# Patient Record
Sex: Male | Born: 1955 | Race: White | Hispanic: No | Smoking: Former smoker
Health system: Southern US, Community
[De-identification: ages and names within clinical notes are randomized; demographics above are authoritative.]

## PROBLEM LIST (undated history)

## (undated) DIAGNOSIS — E785 Hyperlipidemia, unspecified: Secondary | ICD-10-CM

## (undated) DIAGNOSIS — F419 Anxiety disorder, unspecified: Secondary | ICD-10-CM

## (undated) DIAGNOSIS — G8929 Other chronic pain: Secondary | ICD-10-CM

## (undated) DIAGNOSIS — R55 Syncope and collapse: Secondary | ICD-10-CM

## (undated) DIAGNOSIS — S30861A Insect bite (nonvenomous) of abdominal wall, initial encounter: Secondary | ICD-10-CM

## (undated) DIAGNOSIS — J45909 Unspecified asthma, uncomplicated: Secondary | ICD-10-CM

## (undated) DIAGNOSIS — G4730 Sleep apnea, unspecified: Secondary | ICD-10-CM

## (undated) DIAGNOSIS — J449 Chronic obstructive pulmonary disease, unspecified: Secondary | ICD-10-CM

## (undated) DIAGNOSIS — Z720 Tobacco use: Secondary | ICD-10-CM

## (undated) DIAGNOSIS — I6529 Occlusion and stenosis of unspecified carotid artery: Secondary | ICD-10-CM

## (undated) DIAGNOSIS — R569 Unspecified convulsions: Secondary | ICD-10-CM

## (undated) DIAGNOSIS — W57XXXA Bitten or stung by nonvenomous insect and other nonvenomous arthropods, initial encounter: Secondary | ICD-10-CM

## (undated) DIAGNOSIS — E559 Vitamin D deficiency, unspecified: Secondary | ICD-10-CM

## (undated) DIAGNOSIS — R079 Chest pain, unspecified: Secondary | ICD-10-CM

## (undated) DIAGNOSIS — G43909 Migraine, unspecified, not intractable, without status migrainosus: Secondary | ICD-10-CM

## (undated) DIAGNOSIS — R739 Hyperglycemia, unspecified: Secondary | ICD-10-CM

## (undated) DIAGNOSIS — K219 Gastro-esophageal reflux disease without esophagitis: Secondary | ICD-10-CM

## (undated) DIAGNOSIS — M1990 Unspecified osteoarthritis, unspecified site: Secondary | ICD-10-CM

## (undated) HISTORY — DX: Tobacco use: Z72.0

## (undated) HISTORY — DX: Gastro-esophageal reflux disease without esophagitis: K21.9

## (undated) HISTORY — DX: Bitten or stung by nonvenomous insect and other nonvenomous arthropods, initial encounter: W57.XXXA

## (undated) HISTORY — DX: Hyperlipidemia, unspecified: E78.5

## (undated) HISTORY — DX: Chest pain, unspecified: R07.9

## (undated) HISTORY — DX: Syncope and collapse: R55

## (undated) HISTORY — DX: Occlusion and stenosis of unspecified carotid artery: I65.29

## (undated) HISTORY — PX: COLON SURGERY: SHX602

## (undated) HISTORY — PX: GALLBLADDER SURGERY: SHX652

## (undated) HISTORY — DX: Unspecified osteoarthritis, unspecified site: M19.90

## (undated) HISTORY — PX: ROTATOR CUFF REPAIR: SHX139

## (undated) HISTORY — DX: Hyperglycemia, unspecified: R73.9

## (undated) HISTORY — DX: Migraine, unspecified, not intractable, without status migrainosus: G43.909

## (undated) HISTORY — DX: Vitamin D deficiency, unspecified: E55.9

## (undated) HISTORY — DX: Unspecified convulsions: R56.9

## (undated) HISTORY — DX: Insect bite (nonvenomous) of abdominal wall, initial encounter: S30.861A

---

## 2000-03-05 ENCOUNTER — Encounter (INDEPENDENT_AMBULATORY_CARE_PROVIDER_SITE_OTHER): Payer: Self-pay

## 2000-03-05 ENCOUNTER — Other Ambulatory Visit: Admission: RE | Admit: 2000-03-05 | Discharge: 2000-03-05 | Payer: Self-pay | Admitting: Otolaryngology

## 2001-11-28 ENCOUNTER — Ambulatory Visit (HOSPITAL_COMMUNITY): Admission: RE | Admit: 2001-11-28 | Discharge: 2001-11-28 | Payer: Self-pay | Admitting: Pulmonary Disease

## 2001-12-10 ENCOUNTER — Ambulatory Visit (HOSPITAL_COMMUNITY): Admission: RE | Admit: 2001-12-10 | Discharge: 2001-12-10 | Payer: Self-pay | Admitting: Pulmonary Disease

## 2001-12-19 ENCOUNTER — Ambulatory Visit (HOSPITAL_COMMUNITY): Admission: RE | Admit: 2001-12-19 | Discharge: 2001-12-19 | Payer: Self-pay | Admitting: Pulmonary Disease

## 2002-02-04 ENCOUNTER — Ambulatory Visit (HOSPITAL_COMMUNITY): Admission: RE | Admit: 2002-02-04 | Discharge: 2002-02-04 | Payer: Self-pay | Admitting: Cardiology

## 2002-02-21 ENCOUNTER — Ambulatory Visit (HOSPITAL_COMMUNITY): Admission: RE | Admit: 2002-02-21 | Discharge: 2002-02-21 | Payer: Self-pay | Admitting: Pulmonary Disease

## 2002-08-24 ENCOUNTER — Emergency Department (HOSPITAL_COMMUNITY): Admission: EM | Admit: 2002-08-24 | Discharge: 2002-08-24 | Payer: Self-pay | Admitting: Emergency Medicine

## 2002-08-30 ENCOUNTER — Emergency Department (HOSPITAL_COMMUNITY): Admission: EM | Admit: 2002-08-30 | Discharge: 2002-08-31 | Payer: Self-pay | Admitting: Internal Medicine

## 2002-08-31 ENCOUNTER — Encounter: Payer: Self-pay | Admitting: Internal Medicine

## 2003-08-26 ENCOUNTER — Ambulatory Visit (HOSPITAL_COMMUNITY): Admission: RE | Admit: 2003-08-26 | Discharge: 2003-08-26 | Payer: Self-pay | Admitting: Family Medicine

## 2003-08-26 ENCOUNTER — Encounter: Payer: Self-pay | Admitting: Family Medicine

## 2003-10-03 ENCOUNTER — Inpatient Hospital Stay (HOSPITAL_COMMUNITY): Admission: EM | Admit: 2003-10-03 | Discharge: 2003-10-07 | Payer: Self-pay | Admitting: Internal Medicine

## 2003-11-06 ENCOUNTER — Emergency Department (HOSPITAL_COMMUNITY): Admission: EM | Admit: 2003-11-06 | Discharge: 2003-11-06 | Payer: Self-pay | Admitting: Emergency Medicine

## 2004-01-21 ENCOUNTER — Emergency Department (HOSPITAL_COMMUNITY): Admission: EM | Admit: 2004-01-21 | Discharge: 2004-01-21 | Payer: Self-pay | Admitting: Emergency Medicine

## 2004-02-07 ENCOUNTER — Emergency Department (HOSPITAL_COMMUNITY): Admission: EM | Admit: 2004-02-07 | Discharge: 2004-02-07 | Payer: Self-pay | Admitting: Emergency Medicine

## 2004-02-18 ENCOUNTER — Emergency Department (HOSPITAL_COMMUNITY): Admission: EM | Admit: 2004-02-18 | Discharge: 2004-02-18 | Payer: Self-pay | Admitting: Emergency Medicine

## 2004-03-12 ENCOUNTER — Emergency Department (HOSPITAL_COMMUNITY): Admission: EM | Admit: 2004-03-12 | Discharge: 2004-03-12 | Payer: Self-pay | Admitting: Emergency Medicine

## 2004-03-30 ENCOUNTER — Emergency Department (HOSPITAL_COMMUNITY): Admission: EM | Admit: 2004-03-30 | Discharge: 2004-03-30 | Payer: Self-pay | Admitting: Emergency Medicine

## 2004-05-02 ENCOUNTER — Emergency Department (HOSPITAL_COMMUNITY): Admission: EM | Admit: 2004-05-02 | Discharge: 2004-05-02 | Payer: Self-pay | Admitting: Emergency Medicine

## 2004-05-09 ENCOUNTER — Ambulatory Visit (HOSPITAL_COMMUNITY): Admission: RE | Admit: 2004-05-09 | Discharge: 2004-05-09 | Payer: Self-pay | Admitting: *Deleted

## 2004-07-06 ENCOUNTER — Inpatient Hospital Stay (HOSPITAL_COMMUNITY): Admission: RE | Admit: 2004-07-06 | Discharge: 2004-07-20 | Payer: Self-pay | Admitting: Psychiatry

## 2004-07-07 ENCOUNTER — Emergency Department (HOSPITAL_COMMUNITY): Admission: EM | Admit: 2004-07-07 | Discharge: 2004-07-07 | Payer: Self-pay | Admitting: Emergency Medicine

## 2004-09-23 ENCOUNTER — Inpatient Hospital Stay (HOSPITAL_COMMUNITY): Admission: EM | Admit: 2004-09-23 | Discharge: 2004-09-30 | Payer: Self-pay | Admitting: Psychiatry

## 2004-09-23 ENCOUNTER — Ambulatory Visit: Payer: Self-pay | Admitting: Psychiatry

## 2004-12-06 ENCOUNTER — Emergency Department (HOSPITAL_COMMUNITY): Admission: EM | Admit: 2004-12-06 | Discharge: 2004-12-06 | Payer: Self-pay | Admitting: Emergency Medicine

## 2004-12-09 ENCOUNTER — Inpatient Hospital Stay (HOSPITAL_COMMUNITY): Admission: RE | Admit: 2004-12-09 | Discharge: 2004-12-19 | Payer: Self-pay | Admitting: Psychiatry

## 2004-12-09 ENCOUNTER — Ambulatory Visit: Payer: Self-pay | Admitting: Psychiatry

## 2005-03-06 ENCOUNTER — Emergency Department (HOSPITAL_COMMUNITY): Admission: EM | Admit: 2005-03-06 | Discharge: 2005-03-06 | Payer: Self-pay | Admitting: Emergency Medicine

## 2005-06-23 ENCOUNTER — Ambulatory Visit: Payer: Self-pay | Admitting: Psychiatry

## 2005-06-23 ENCOUNTER — Inpatient Hospital Stay (HOSPITAL_COMMUNITY): Admission: RE | Admit: 2005-06-23 | Discharge: 2005-06-28 | Payer: Self-pay | Admitting: Psychiatry

## 2006-05-14 ENCOUNTER — Observation Stay (HOSPITAL_COMMUNITY): Admission: EM | Admit: 2006-05-14 | Discharge: 2006-05-16 | Payer: Self-pay | Admitting: Emergency Medicine

## 2006-05-14 ENCOUNTER — Ambulatory Visit: Payer: Self-pay | Admitting: Internal Medicine

## 2006-05-14 ENCOUNTER — Ambulatory Visit: Payer: Self-pay | Admitting: *Deleted

## 2006-05-15 ENCOUNTER — Encounter (INDEPENDENT_AMBULATORY_CARE_PROVIDER_SITE_OTHER): Payer: Self-pay | Admitting: *Deleted

## 2006-07-04 ENCOUNTER — Ambulatory Visit: Payer: Self-pay | Admitting: Internal Medicine

## 2006-07-11 ENCOUNTER — Ambulatory Visit (HOSPITAL_COMMUNITY): Admission: RE | Admit: 2006-07-11 | Discharge: 2006-07-11 | Payer: Self-pay | Admitting: Internal Medicine

## 2006-08-30 ENCOUNTER — Emergency Department (HOSPITAL_COMMUNITY): Admission: EM | Admit: 2006-08-30 | Discharge: 2006-08-30 | Payer: Self-pay | Admitting: Emergency Medicine

## 2006-09-11 ENCOUNTER — Ambulatory Visit: Payer: Self-pay | Admitting: Internal Medicine

## 2006-09-17 ENCOUNTER — Ambulatory Visit: Payer: Self-pay | Admitting: Internal Medicine

## 2006-09-17 ENCOUNTER — Ambulatory Visit (HOSPITAL_COMMUNITY): Admission: RE | Admit: 2006-09-17 | Discharge: 2006-09-17 | Payer: Self-pay | Admitting: Internal Medicine

## 2006-09-17 ENCOUNTER — Encounter (INDEPENDENT_AMBULATORY_CARE_PROVIDER_SITE_OTHER): Payer: Self-pay | Admitting: Specialist

## 2006-10-01 ENCOUNTER — Emergency Department (HOSPITAL_COMMUNITY): Admission: EM | Admit: 2006-10-01 | Discharge: 2006-10-01 | Payer: Self-pay | Admitting: Emergency Medicine

## 2006-10-04 ENCOUNTER — Ambulatory Visit: Payer: Self-pay | Admitting: Internal Medicine

## 2006-10-17 ENCOUNTER — Emergency Department (HOSPITAL_COMMUNITY): Admission: EM | Admit: 2006-10-17 | Discharge: 2006-10-17 | Payer: Self-pay | Admitting: Emergency Medicine

## 2006-10-18 ENCOUNTER — Ambulatory Visit: Payer: Self-pay | Admitting: Internal Medicine

## 2006-10-19 ENCOUNTER — Ambulatory Visit (HOSPITAL_COMMUNITY): Admission: RE | Admit: 2006-10-19 | Discharge: 2006-10-19 | Payer: Self-pay | Admitting: Internal Medicine

## 2006-10-19 ENCOUNTER — Encounter (INDEPENDENT_AMBULATORY_CARE_PROVIDER_SITE_OTHER): Payer: Self-pay | Admitting: Specialist

## 2006-12-06 ENCOUNTER — Ambulatory Visit: Payer: Self-pay | Admitting: Internal Medicine

## 2006-12-17 ENCOUNTER — Ambulatory Visit: Payer: Self-pay | Admitting: Internal Medicine

## 2006-12-18 ENCOUNTER — Ambulatory Visit (HOSPITAL_COMMUNITY): Admission: RE | Admit: 2006-12-18 | Discharge: 2006-12-18 | Payer: Self-pay | Admitting: Internal Medicine

## 2006-12-21 ENCOUNTER — Ambulatory Visit (HOSPITAL_COMMUNITY): Admission: RE | Admit: 2006-12-21 | Discharge: 2006-12-21 | Payer: Self-pay | Admitting: Internal Medicine

## 2006-12-28 ENCOUNTER — Emergency Department (HOSPITAL_COMMUNITY): Admission: EM | Admit: 2006-12-28 | Discharge: 2006-12-28 | Payer: Self-pay | Admitting: Emergency Medicine

## 2007-01-01 ENCOUNTER — Inpatient Hospital Stay (HOSPITAL_COMMUNITY): Admission: RE | Admit: 2007-01-01 | Discharge: 2007-01-03 | Payer: Self-pay | Admitting: General Surgery

## 2007-03-15 ENCOUNTER — Ambulatory Visit (HOSPITAL_COMMUNITY): Admission: RE | Admit: 2007-03-15 | Discharge: 2007-03-15 | Payer: Self-pay | Admitting: General Surgery

## 2007-07-31 ENCOUNTER — Ambulatory Visit: Payer: Self-pay | Admitting: Internal Medicine

## 2007-08-06 ENCOUNTER — Emergency Department (HOSPITAL_COMMUNITY): Admission: EM | Admit: 2007-08-06 | Discharge: 2007-08-07 | Payer: Self-pay | Admitting: Emergency Medicine

## 2007-09-27 IMAGING — CT CT PELVIS W/ CM
2 of 5 series · 16 of 46 positions shown, 18 images · IV contrast (Omnipaque 300)
Comparison: none

HISTORY: Right upper quadrant pain, nausea

[Series 2: abd_pel 5.0 b40f · axial · 0.74mm/px · z∈[-505,-100]mm · 13 of 95 slices shown, 15 images]
[im 7/95  soft-tissue]
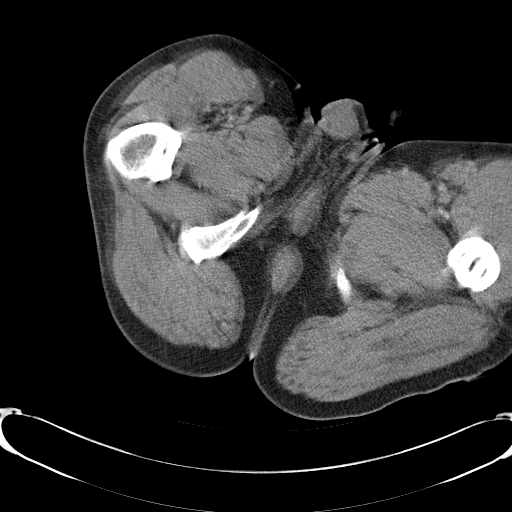
[im 7/95  bone]
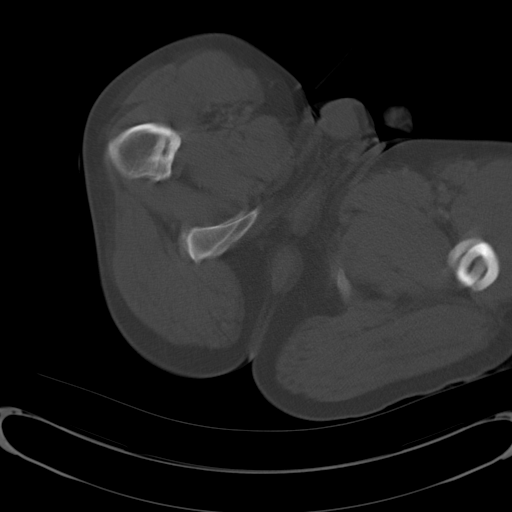
[im 14/95  soft-tissue]
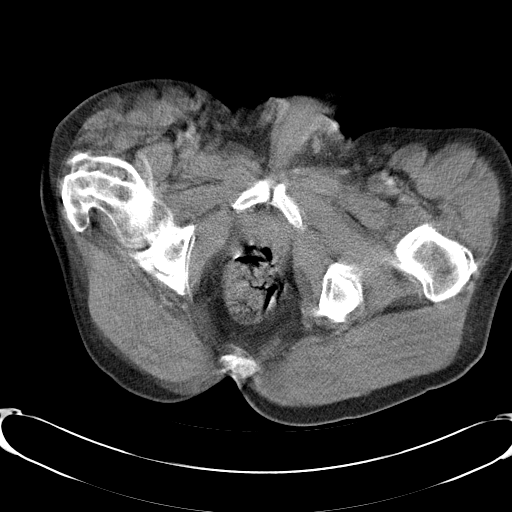
[im 21/95  soft-tissue]
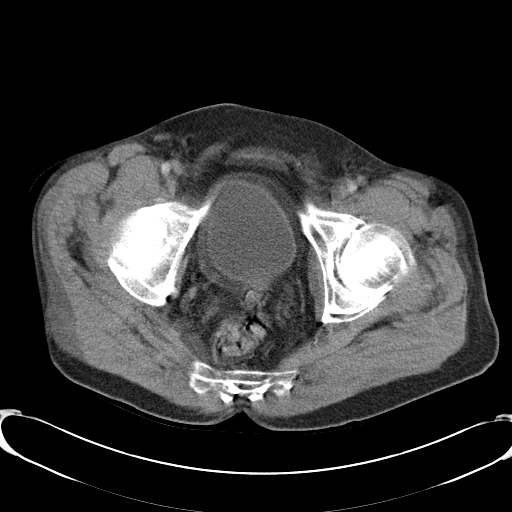
[im 27/95  soft-tissue]
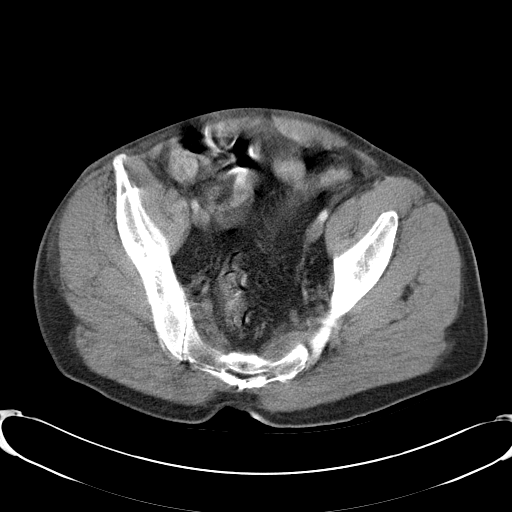
[im 34/95  soft-tissue]
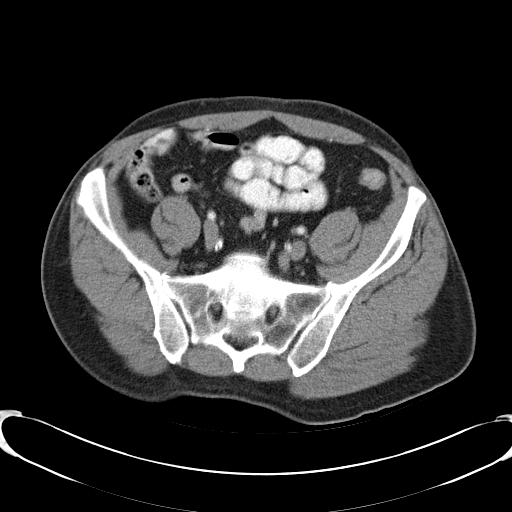
[im 41/95  soft-tissue]
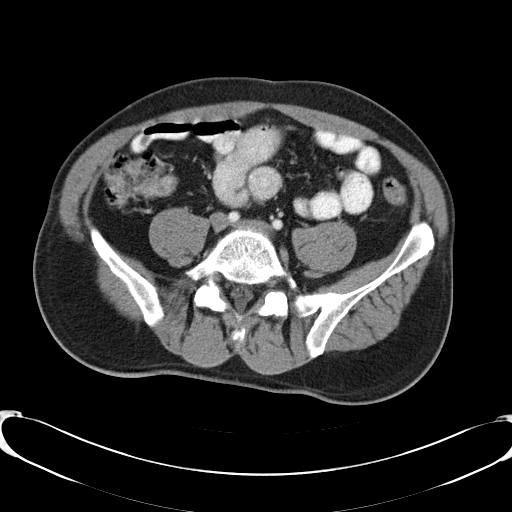
[im 48/95  soft-tissue]
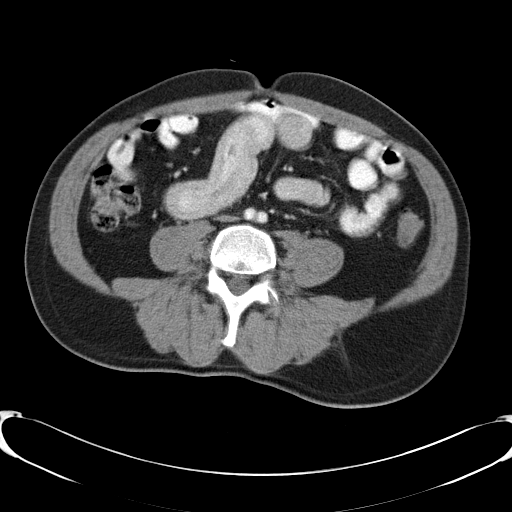
[im 54/95  soft-tissue]
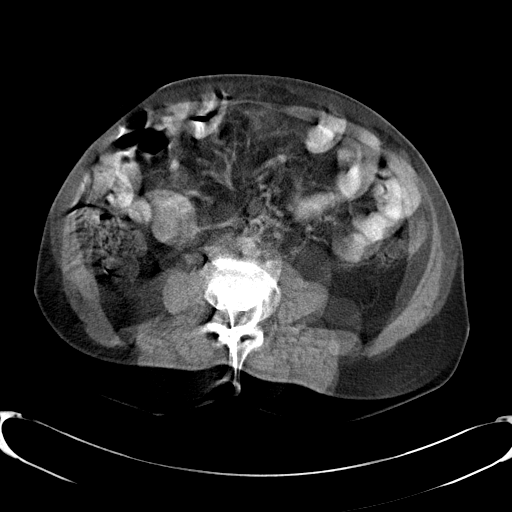
[im 61/95  soft-tissue]
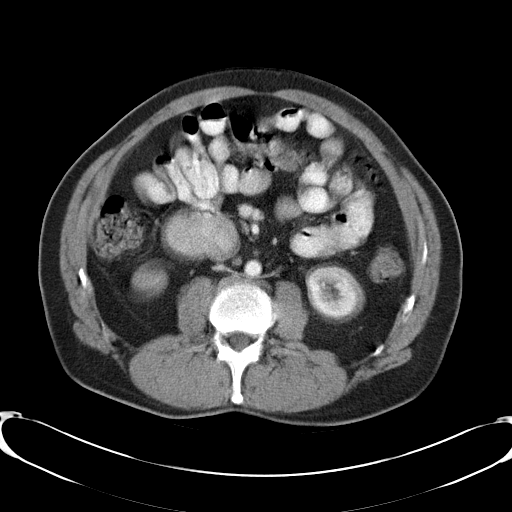
[im 61/95  bone]
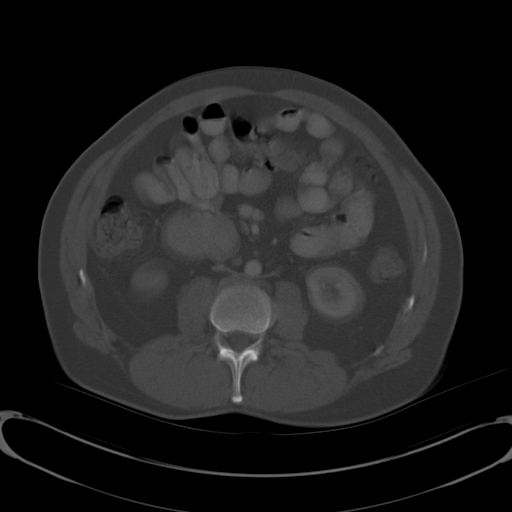
[im 68/95  soft-tissue]
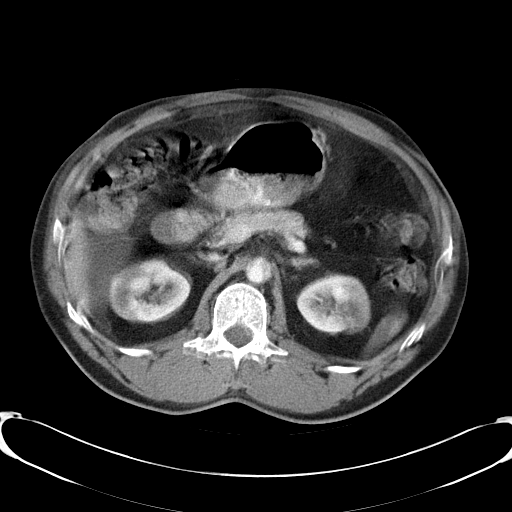
[im 74/95  soft-tissue]
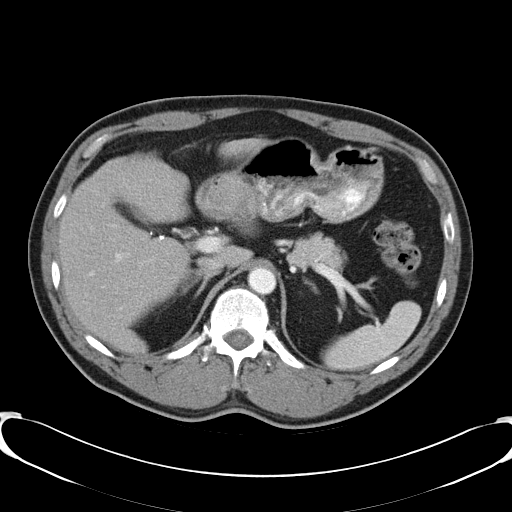
[im 81/95  soft-tissue]
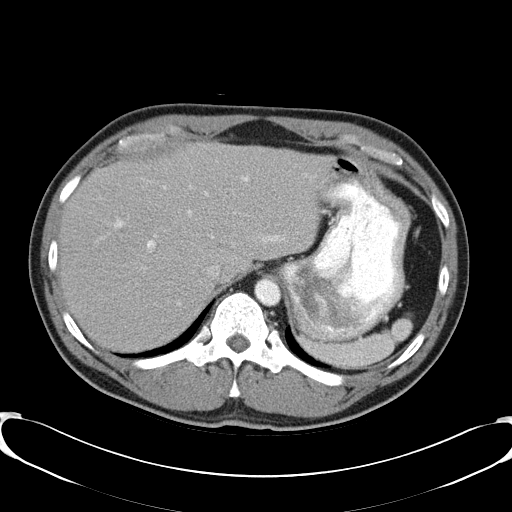
[im 88/95  soft-tissue]
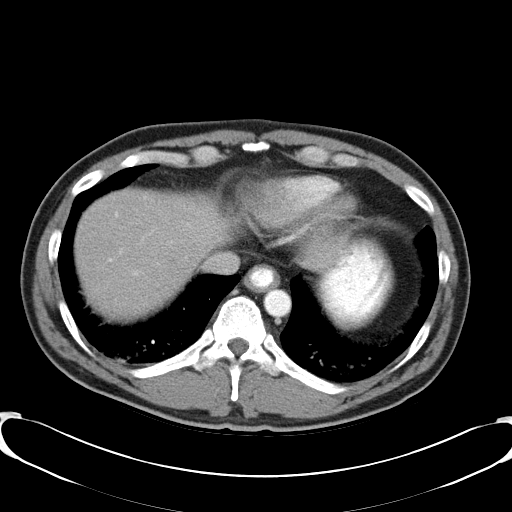

[Series 5: kidney delay 5.0 spo cor · coronal · delayed · 0.68mm/px · 3 of 44 slices shown]
[im 15/44  soft-tissue]
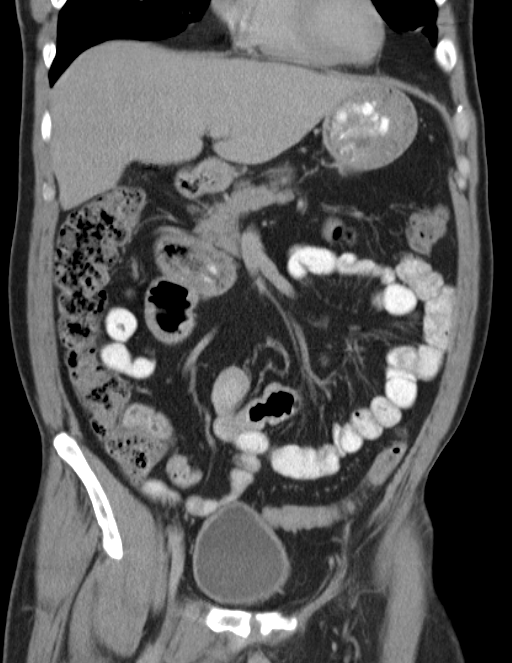
[im 20/44  soft-tissue]
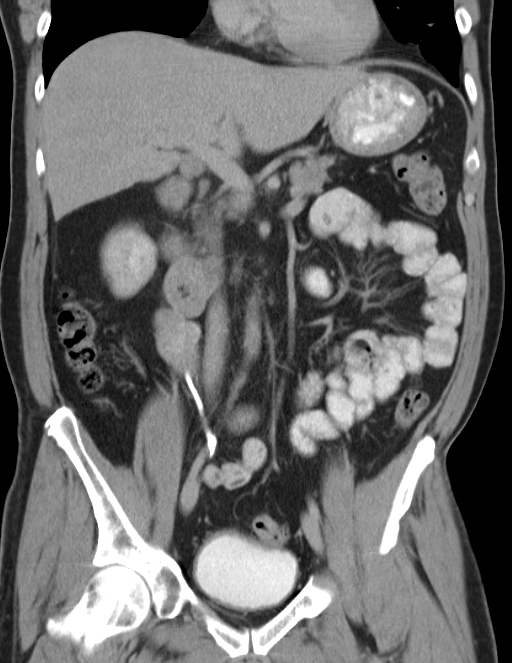
[im 24/44  soft-tissue]
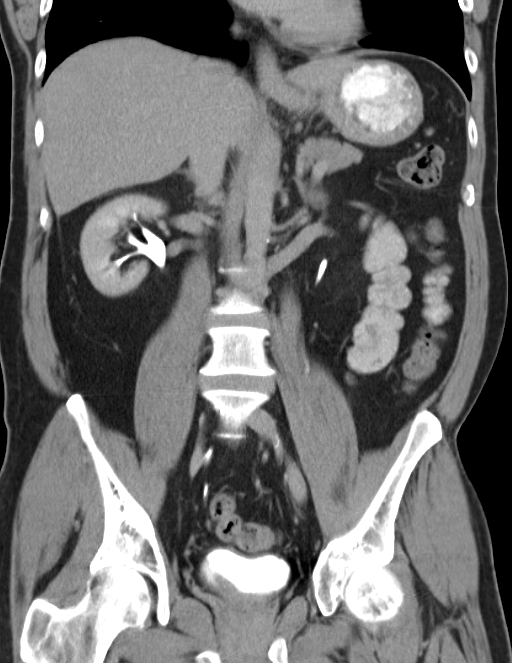

[16 of 46 positions shown; findings below may reference images not displayed]

CT ABDOMEN AND PELVIS WITH CONTRAST:

Multidetector helical CT imaging abdomen and pelvis performed.
Sagittal and coronal images are reconstructed from the axial data set.
Exam utilized dilute oral contrast and 100 cc Bmnipaque-KPP.
Comparison 05/16/2006

CT ABDOMEN:

Bibasilar atelectasis.
Minimally nodular contours of liver without discrete mass.
Status post cholecystectomy.
Spleen, pancreas, kidneys, and adrenal glands normal.
Patient vomited during exam and respiratory motion artifacts degrade initial
portal venous phase images.
Delayed images and reconstructed sagittal/coronal images show no significant
motion artifacts
.
Question large duodenal diverticulum versus redundant descending duodenum.
Third portion of duodenum fails to traverse the aorta into the left upper
quadrant, with abnormal position of ligament of Treitz, compatible with small
bowel malrotation.
Probable tiny splenule at splenic hilum versus lymph node.
Food debris in stomach.
Large and small bowel loops otherwise unremarkable.
No mass, adenopathy, free fluid, or inflammatory process.
IMPRESSION: Small bowel malrotation, cannot exclude duodenal diverticulum.
Bibasilar atelectasis.
No acute upper abdominal process.

CT PELVIS:

Descending and sigmoid colon unopacified by contrast, slightly limiting exam.
Pelvic bowel loops otherwise unremarkable.
Ureters and bladder show no significant abnormalities.
No pelvic mass, adenopathy, or free fluid.
Bones unremarkable.
IMPRESSION: No acute intrapelvic abnormalities.

## 2007-10-01 ENCOUNTER — Ambulatory Visit: Payer: Self-pay | Admitting: Internal Medicine

## 2007-11-13 ENCOUNTER — Ambulatory Visit (HOSPITAL_BASED_OUTPATIENT_CLINIC_OR_DEPARTMENT_OTHER): Admission: RE | Admit: 2007-11-13 | Discharge: 2007-11-13 | Payer: Self-pay | Admitting: Rheumatology

## 2007-11-17 ENCOUNTER — Ambulatory Visit: Payer: Self-pay | Admitting: Internal Medicine

## 2008-06-02 ENCOUNTER — Ambulatory Visit: Payer: Self-pay | Admitting: Gastroenterology

## 2008-06-10 ENCOUNTER — Encounter: Admission: RE | Admit: 2008-06-10 | Discharge: 2008-06-25 | Payer: Self-pay | Admitting: Internal Medicine

## 2008-07-17 ENCOUNTER — Ambulatory Visit: Payer: Self-pay | Admitting: Internal Medicine

## 2008-07-22 ENCOUNTER — Encounter: Payer: Self-pay | Admitting: Gastroenterology

## 2008-07-22 ENCOUNTER — Ambulatory Visit: Payer: Self-pay | Admitting: Gastroenterology

## 2008-07-22 ENCOUNTER — Ambulatory Visit (HOSPITAL_COMMUNITY): Admission: RE | Admit: 2008-07-22 | Discharge: 2008-07-22 | Payer: Self-pay | Admitting: Internal Medicine

## 2008-08-20 ENCOUNTER — Ambulatory Visit: Payer: Self-pay | Admitting: Internal Medicine

## 2008-11-18 ENCOUNTER — Ambulatory Visit: Payer: Self-pay | Admitting: Internal Medicine

## 2008-11-23 ENCOUNTER — Ambulatory Visit (HOSPITAL_COMMUNITY): Admission: RE | Admit: 2008-11-23 | Discharge: 2008-11-23 | Payer: Self-pay | Admitting: Internal Medicine

## 2008-12-03 ENCOUNTER — Ambulatory Visit (HOSPITAL_COMMUNITY): Admission: RE | Admit: 2008-12-03 | Discharge: 2008-12-03 | Payer: Self-pay | Admitting: Internal Medicine

## 2008-12-03 ENCOUNTER — Ambulatory Visit: Payer: Self-pay | Admitting: Internal Medicine

## 2009-01-01 ENCOUNTER — Ambulatory Visit: Payer: Self-pay | Admitting: Internal Medicine

## 2009-02-17 ENCOUNTER — Ambulatory Visit (HOSPITAL_COMMUNITY): Admission: RE | Admit: 2009-02-17 | Discharge: 2009-02-17 | Payer: Self-pay | Admitting: Otolaryngology

## 2009-02-24 ENCOUNTER — Telehealth: Payer: Self-pay | Admitting: Internal Medicine

## 2009-02-25 ENCOUNTER — Encounter: Payer: Self-pay | Admitting: Internal Medicine

## 2009-03-22 ENCOUNTER — Encounter: Payer: Self-pay | Admitting: Internal Medicine

## 2011-04-18 NOTE — H&P (Signed)
NAME:  Trevor Newman, Trevor Newman                ACCOUNT NO.:  [id number]   MEDICAL RECORD NO.:  [id number]          PATIENT TYPE:  AMB   LOCATION:  DAY                           FACILITY:  [facility]   PHYSICIAN:  R. Roetta Sessions, M.D. DATE OF BIRTH:  [birth year]   DATE OF ADMISSION:  DATE OF DISCHARGE:  LH                              HISTORY & PHYSICAL   CHIEF COMPLAINT:  Recurrent esophageal dysphagia.   A 55 year old Caucasian male with long-standing gastroesophageal reflux  disease, well controlled on Nexium 40 mg orally b.i.d.  He has had  recurrent esophageal dysphagia.  He has been dilated by Dr. Karilyn Cota  previously and most recently by Dr. Kassie Mends .  On July 22, 2008,  it was felt that the distal esophagus was somewhat narrowed.  He was  dilated up to 16-mm with a Savary.  There was erythema in the body of  the antrum.  Biopsies were positive for H. pylori gastritis.  He did  take a 14-day course of Prevpac therapy.  He tells me after passing of a  Maloney dilator, his dysphagia significantly improved, but now over the  past 6 weeks since it is now recurred.  Previously, he has a history of  elevated peripheral eosinophilia and polycythemia for which he has been  seen by hematologist.  He has had his esophagus biopsied now twice and  there was no evidence of eosinophilic esophagitis.  There is no history  of nausea, vomiting, or diarrhea.  If he think he is constipated, he is  to take MiraLax on a regular basis.   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  He has seen Dr. Simonne Come over at [facility],  of  elevated liver enzymes with biopsy-proven mild steatosis on the prior  liver biopsy at the time of cholecystectomy.  Also, significant for  depression and gastroesophageal reflux disease.   CURRENT MEDICATIONS:  1. Nexium 40 mg orally b.i.d.  2. Alprazolam __________ 0.5 mg tablets at bedtime.  3. MiraLax 70 g p.r.n.  4. Fenofibrate daily.  5. Milk of magnesia daily.  6. Darvocet.  7. Lexapro 10 mg daily.   ALLERGIES:  CODEINE and HYDROCODONE.   FAMILY HISTORY:  Mother has good health.  Father died in his 1s  secondary to lung cancer.  No history of chronic GI or liver illness,  otherwise.   SOCIAL HISTORY:  The patient is disabled, previously a Naval architect.  He  stopped smoking 6 months ago and he is healthy.  He has not had any  chest pain, dyspnea on exertion.  No fever or chills.   PHYSICAL EXAMINATION:  GENERAL:  Pleasant 55 year old gentleman resting  comfortably.  VITAL SIGNS:  Weight 195, height 5 feet 8 inches, temperature 97.8, BP  110/76, and pulse 80.  SKIN:  Warm and dry.  There is no jaundice or continuous stigmata of  chronic liver disease.  HEENT:  No scleral icterus.  Conjunctivae are pink.  CHEST:  Lungs clear to auscultation.  CARDIAC:  Regular rate and rhythm without murmur, gallop, rub.  ABDOMEN:  Nondistended.  Positive bowel sounds.  Soft and nontender.  No  appreciable mass or organomegaly.   IMPRESSION:  [name] was a pleasant 55 year old gentleman with  recurrent esophageal dysphagia to solids.  He has been dilated multiple  times previously.  Last session by Dr. Cira Servant back in August.  He was  dilated at that time with a 16-mm Savary, which corresponds to  approximately 48-French bougie size, which are relatively small-bore  dilator.  It maybe that [name] simply has a recalcitrant Schatzki ring  or perhaps underlying esophageal motility disorder.  There is another  possibility as to etiology of his dysphagia.  No evidence of his  eosinophilic esophagitis on biopsies during 2 different EGD sessions.  He will likely end up needing his esophagus dilated again, however, for  offering the EGD with dilation while he get a barium plus esophagram to  see how  he stand from a radiographic standpoint.  I discussed this and  approach with him, if the pill hangs in the GE junction, we will  certainly bring him back probably just after Christmas for EGD with  esophageal dilation.  We will try to pass as a large-bore dilator as  appropriate to give him some longer standing relief and esophageal  manometry has not had a question depending on how he does in the near  future with the above-mentioned interventions.  Further recommendations  to follow.      Jonathon Bellows, M.D.  Electronically Signed     RMR/MEDQ  D:  11/18/2008  T:  11/19/2008  Job:  [id number]   cc:   Alain Honey  Fax: [phone]

## 2011-04-18 NOTE — Assessment & Plan Note (Signed)
NAMEMarland Kitchen  Trevor Newman                 CHART#:  [id number]   DATE:  08/20/2008                       DOB:  1956/10/28   Last seen by [place] on 07/22/2008 with Dr. Cira Servant performed EGD with  esophageal dilation and biopsy of the gastric esophageal mucosa.  There  is a question of a distal esophageal web.  She would pass a 60-mm Savory  dilator.  Moderate gastritis found.  Biopsies positive for H. pylori,  but he took 14 days for the Prevpac therapy.  Esophageal biopsy  demonstrated mild gastroesophageal reflux.  No evidence of Barrett  esophagus or neoplasm.  The patient tells me overall he had some  abdominal pain following an EGD in the past and overall, he feels much  better and being treated for H. pylori.  He has some breakthrough reflux  symptoms in the evening and sometimes he takes an extra Nexium.  He is  trying to get a bit on the empty stomach.  Overall, he feels he is doing  very well.  He now sees Dr. Alain Newman in [facility] practice,  went to [place], [place] to his primary care physician.  He has a  history of colonic adenoma removed by Dr. Karilyn Cota back in 2007.  He is  due for a surveillance exam in 2014.  His gallbladder is out.  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  History of  pancreatitis a couple of years ago, which has not recurred, etiology  uncertain to me at this time.  History of mildly elevated liver enzymes  with subsequent normalization.  He had a CBC done on 08/03/2008 by his  hematologist over [facility].  H&H was normal at 15.8 and 46.3.   CURRENT MEDICATIONS:  See updated list.  His constipation is well  managed with MiraLax 17 g orally daily p.r.n.  He takes Benefiber daily.   ALLERGIES:  Codeine and hydrocodone.   PHYSICAL EXAMINATION:  GENERAL:  He looks good.  VITAL SIGNS:  Weight 186.5, height 5 feet 8 inches, temperature 97.9, BP  124/80, and pulse 72.  SKIN:  Warm and dry.  CHEST:  Lungs are clear to  auscultation.  CARDIAC:  Regular rate and rhythm without murmur, gallop, or rub.  ABDOMEN:  Nondistended, positive bowel sounds, soft and nontender  without appreciable mass or organomegaly.   ASSESSMENT:  The patient is a pleasant 55 year old gentleman with H.  pylori gastritis status post treatment with improvement in symptoms.  He  also has gastroesophageal reflux disease some evening breakthrough  symptoms and history of colonic adenoma, due for surveillance in 2014.  I reviewed antireflux lifestyle/diet measures with the patient.  I have  asked him for the next 3 months just go ahead and bump his Nexium up to  40 mg before breakfast and before  supper.  Samples and prescription were given.  Unless something comes  up, I will plan to see this nice gentleman back in 3 months.  Dysphagia  resolved, status post esophageal dilation.       Trevor Newman, M.D.  Electronically Signed     RMR/MEDQ  D:  08/20/2008  T:  08/21/2008  Job:  [id number]   cc:   Trevor Newman

## 2011-04-18 NOTE — Op Note (Signed)
NAME:  Trevor Newman, Trevor Newman NO.:  [id number]   MEDICAL RECORD NO.:  [id number]          PATIENT TYPE:  AMB   LOCATION:  DAY                           FACILITY:  [facility]   PHYSICIAN:  Kassie Mends, M.D.      DATE OF BIRTH:  07/02/1956   DATE OF PROCEDURE:  07/22/2008  DATE OF DISCHARGE:                               OPERATIVE REPORT   REFERRING PHYSICIAN:  Alain Honey, MD   PRIMARY GASTROENTEROLOGIST:  Jonathon Bellows, MD   PROCEDURE:  Esophagogastroduodenoscopy with cold forceps biopsies of the  esophageal and gastric mucosa with Savary dilation to 16 mm.   INDICATION FOR EXAM:  [name] is a 55 year old male who has complaints  of problems with swallowing solids and pills.  He was last dilated by  Dr. Charna Elizabeth in 2004.  It was empiric dilation.  He has been followed  for peripheral eosinophilia.  Apparently, distal esophageal biopsies  were obtained in 2007 which showed evidence of reflux disease, but no  increased number of eosinophils.  He was seen in an emergency department  reportedly and told that he needs to have his esophagus stretched  sooner.  He said he could not wait for Dr. Jena Gauss to get back and wanted  his esophagus stretched this week.   FINDINGS:  1. Distal esophageal prominence, which did not relax with swallowing      or burping.  The esophageal lumen relaxed to approximately 14 mm.      The evaluation of his distal esophagus was performed in a retroflex      view.  Proximal to this area, he appeared to have a sigmoid      esophagus.  The proximal esophagus was biopsied at approximately 23-      24 cm from the teeth to evaluate for eosinophilic gastritis and/or      eosinophilic esophagitis.  The distal esophagus was biopsied at      approximately 34 cm from the teeth to evaluate for eosinophilic      esophagitis.  The distal esophagus was dilated to 16 mm using the      Savary dilator.  He had no evidence of Barrett's, mass, erosions,      or  ulcerations.  2. Diffuse erythema of the body and the antrum.  The stomach without      erosion or ulceration.  Biopsies obtained via cold forceps to      evaluate for H. pylori gastritis.  3. Normal duodenal bulb.  The scope was passed to the site of his      duodenojejunostomy.  He had an afferent and an efferent limb which,      were normal.  The anastomosis was normal.  He had moderate bile      staining.   DIAGNOSES:  1. Complained of solid and pill dysphagia with a history of      gastroesophageal reflux disease.  No evidence of erosive      esophagitis.  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  3. Moderate gastritis.   RECOMMENDATIONS:  1. [name] had  an upper GI, which included a barium swallow in      January 2008 that showed normal esophageal distention and motility      with no strictures, mass, or intraluminal defects.  He has been      dilated to 16 mm with the Savary dilator which should be sufficient      to resolve solid dysphagia.  He may follow up with Dr. Jena Gauss in      regards to his dysphagia.  2. No aspirin, NSAIDs, or anticoagulation for 7 days.  3. He should avoid gastric irritants.  He is given a handout on      gastritis and gastric irritants.  4. He should continue his Nexium daily.  5. We will call Mr. Thune with the results of his biopsies.  6. Follow up in 4 weeks with Dr. Jena Gauss for his dysphagia.   MEDICATIONS:  1. Fentanyl 125 mcg IV.  2. Versed 8 mg IV.  3. Phenergan 25 mg IV.   PROCEDURE TECHNIQUE:  Physical exam was performed.  Informed consent was  obtained from the patient after explaining the benefits, risks, and  alternatives to the procedure.  The patient was connected to the monitor  and placed in the left lateral position.  Continuous oxygen was provided  by nasal cannula.  IV medicine administered through an indwelling  cannula.  After administration of sedation, the patient's esophagus was  intubated.  The scope was advanced under direct  visualization to the  second portion of the duodenum.  The patient was agitated as the scope  passed through his GE junction.  The scope was withdrawn slowly by  carefully examining the color, texture, anatomy, and integrity of the  mucosa on the way out.  Prior to withdrawal of the scope, biopsies were  obtained from the esophagus and the gastric mucosa.  Prior to removal of  the scope, the Savary guidewire was placed.  Each dilator was introduced  successively over the wire from 15 mm to 16 mm.  The patient was  agitated as the dilators passed through his UES.  The patient was  recovered in endoscopy and discharged home in satisfactory condition.      Kassie Mends, M.D.  Electronically Signed     SM/MEDQ  D:  07/22/2008  T:  07/22/2008  Job:  [id number]   cc:   Alain Honey  Fax: [phone]   R. Roetta Sessions, M.D.  [address]  [place]  [place] [address]

## 2011-04-18 NOTE — Assessment & Plan Note (Signed)
NAMEMarland Kitchen  KAELEM, BRACH                 CHART#:  [id number]   DATE:  01/01/2009                       DOB:  1956/03/21   PRIMARY CARE PHYSICIAN:  Dr. Manson Passey at [facility]   CHIEF COMPLAINT:  Dysphagia, right abdominal pain.   SUBJECTIVE:  The patient is a 55 year old Caucasian male.  He was last  seen by Dr. Jena Gauss when he underwent EGD with an attempted [facility]  dilatation on December 03, 2008.  He has somewhat baggy, but otherwise  normal-appearing tubular esophagus.  Maloney dilator did not pass as  soon as Dr. Jena Gauss got to the hypopharynx, the patient became wild and  combative.  As soon as he backed off, he went back to the sleep, and he  had the same response in the second attempt.  No further efforts were  made to dilate at that point.  He had a normal stomach status post  enteroanastomosis, 2 small bulbar ulcers, otherwise normal.  He was  referred to [facility] for esophageal manometry.  He had had a previous  failed esophageal manometry at [facility], so the patient did not want  to proceed with this Dr. Jena Gauss then wanted to send the patient for  consultation for his chronic right-sided abdominal pain.  He tells me  today that he does not want to go back to [place] at this point.  He  tells me he continues to have a cough.  He has had some wheezing.  He  feels as though food gets stuck at points to His and tells me it feels  like a stuck in the back of his throat.  He denies any shortness of  breath.  He has chronic upper abdominal pain and right upper quadrant  abdominal pain.  He has been treated for H. pylori previously.  He is  status post surgery for malrotated small bowel previously.  He has had  his gallbladder out.  He had a history of pancreatitis couple years ago,  which has not recurred, history of mild liver enzyme elevation, which  has resolved.  He has history of colonic adenoma, due for surveillance  in February 2014.  He continues to take MiraLax for  his constipation  p.r.n. and Nexium 40 mg b.i.d.   CURRENT MEDICATIONS:  See at the list from January 01, 2009.   ALLERGIES:  CODEINE and HYDROCODONE.   OBJECTIVE:  VITAL SIGNS:  Weight 196.5 pounds, height 68 inches,  temperature 97.5, pulse 84, and blood pressure 122/84.  GENERAL:  The patient is a well-nourished Caucasian male in no acute  distress.  HEENT:  Sclerae clear and nonicteric.  Conjunctivae pink.  Oropharynx  pink and moist without any lesions.  CHEST:  Heart regular rate and rhythm.  Normal S1 and S2 without any  murmurs, clicks, rubs, or gallops.  ABDOMEN:  Positive bowel sounds x4.  No bruits auscultated.  Soft,  nontender, and nondistended without palpable mass or hepatosplenomegaly.  No rebound, tenderness or guarding.  EXTREMITIES:  Without clubbing, cyanosis or edema.   ASSESSMENT:  The patient is a 55 year old Caucasian male with history of  chronic upper abdominal pain and globus sensation as well as dysphagia.  Recently attempted esophagogastroduodenoscopy with [facility] dilatation  was unsuccessful.  He did have a somewhat baggy, but normal esophagus.  He is status  post surgery for malrotated small bowel previously.  He  does have chronic gastroesophageal reflux disease and history of  Helicobacter pylori.  He has not responded to proton pump inhibitor.  He  refuses manometry and he tells me does not want to go back to [facility]  for second opinion of his abdominal pain at this point.  He has a  history of polycythemia.   PLAN:  1. We will review records from hematologist.  2. Would consider referral to ENT for chronic globus sensation since      he does not want to proceed with esophageal manometry at this time.  3. He is to follow up with his primary care Ramatoulaye Pack for his wheezing      and as it may be an exacerbation of his asthma as soon as possible      and he agrees with this plan.       Lorenza Burton, N.P.  Electronically Signed     R.  Roetta Sessions, M.D.  Electronically Signed    KJ/MEDQ  D:  01/01/2009  T:  01/02/2009  Job:  [id number]

## 2011-04-18 NOTE — Op Note (Signed)
NAME:  Trevor Newman, Trevor Newman                ACCOUNT NO.:  [id number]   MEDICAL RECORD NO.:  [id number]          PATIENT TYPE:  AMB   LOCATION:  DAY                           FACILITY:  [facility]   PHYSICIAN:  R. Roetta Sessions, M.D. DATE OF BIRTH:  04-17-1956   DATE OF PROCEDURE:  12/03/2008  DATE OF DISCHARGE:                               OPERATIVE REPORT   PROCEDURE:  Esophagogastroduodenoscopy diagnostic with attempted [facility]  dilation.   INDICATIONS FOR PROCEDURE:  A 55 year old gentleman with recurrent  esophageal dysphagia to solids.  He responded only briefly to most  recent dilation with a small-bore Savary dilator.  Barium pill  esophagram demonstrated no impediment to transcend the barium pill into  the stomach.  Decided to bring him back and size up the upper GI tract  and attempt passage of a larger bore dilator prior to referring him for  esophageal motility studies.  This approach has been arranged previously  and again today at bedside.  Risks, benefits, alternatives, and  limitations had been reviewed.  Please see the documentation in the  medical record.   PROCEDURE NOTE:  O2 saturation, blood pressure, pulse, and respirations  were monitored throughout the entire procedure.   CONSCIOUS SEDATION:  Versed 5 mg IV and fentanyl 125 mcg IV in divided  doses.  Phenergan 25 mg diluted slow IV push to augment conscious  sedation.  Cetacaine spray for topical pharyngeal anesthesia.   INSTRUMENT:  Pentax video chip system.   FINDINGS:  Examination of the tubular esophagus revealed a slightly  baggy tubular esophagus, mucosa appeared normal down through to the EG  junction.  There appeared to be fairly good peristalsis (endoscopic  observation).  EG junction was easily traversed.  The stomach:  Gas  cavity was insufflated well with air.  Thorough examination of the  gastric mucosa including retroflexed proximal stomach esophagogastric  junction demonstrated no abnormalities.  Pylorus  was patent, easily  traversed.  Examination of the bulb and second portion revealed the  entero-entero anastomosis along the more distal duodenum.  Please see  photos.  There were 2 tiny erosions in the bulb; otherwise, mucosa and  small bowel appeared entirely normal.   Therapeutic/diagnostic maneuvers performed.  The scope was withdrawn,  and I attempted to pass a 56-French Maloney dilator.  The patient was  adequately sedated.  As soon as I got to hypopharynx, the patient became  wild and combative.  As soon as I backed off, he went back to sleep, was  heavily sedated, and I repeated my attempt and got the same response.  The [facility] dilator was not passed because he was intolerant to this.  No further efforts were made at passage of the dilator.  The patient  tolerated the procedure overall well and was reactive in Endoscopy.   IMPRESSION:  Somewhat baggy but otherwise normal-appearing tubular  esophagus.  Maloney dilator did not pass as described above.  Normal  stomach, status post entero-entero anastomosis, 2 small bulbar erosions.  Otherwise, normal small bowel.   I doubt that passage of a large-bore Maloney dilator today would  have  made much difference in swallowing and his dysphagia symptoms given the  endoscopic findings.  Therefore, I told Mr. and Mrs. Emond, we will go  ahead and proceed with an esophageal manometry at [facility] to further evaluate him as to the etiology of his dysphagia.  Further recommendations to follow in the very near future.      Jonathon Bellows, M.D.  Electronically Signed     RMR/MEDQ  D:  12/03/2008  T:  12/04/2008  Job:  [id number]   cc:   Dr. Arline Asp

## 2011-04-18 NOTE — Procedures (Signed)
NAME:  Trevor Newman, Trevor Newman NO.:  [id number]   MEDICAL RECORD NO.:  [id number]          PATIENT TYPE:  OUT   LOCATION:  SLEEP CENTER                 FACILITY:  [facility]   PHYSICIAN:  Clinton D. Maple Hudson, MD, FCCP, FACPDATE OF BIRTH:  12/25/55   DATE OF STUDY:  11/13/2007                            NOCTURNAL POLYSOMNOGRAM   REFERRING PHYSICIAN:  Aundra Dubin, MD   INDICATION FOR STUDY:  Insomnia with sleep apnea, Epworth  score 3/24,  BMI 26, weight 172 pounds, height 68 inches.   MEDICATIONS:  Home medications:  Charted reviewed.   Sleep architecture:  Total sleep time 412 minutes with sleep deficiency  90%, stage I was 10%, stage II 81%, stage III absent, REM 8.8% of total  sleep time.  Sleep latency 7 minutes, REM latency 236 minutes.  Awake  after sleep onset 36 minutes, arousable index 10.  Clonazepam was taken  at 8:30 p.m.   Respiratory data:  Apnea/hypopnea index (AHI) 1.7 obstructed events per  hour which is within normal limits.  This reflected a total of 3  obstructed apneas and 9 hypopneas.  Most of that occurred while supine  and in REM.  There were insufficiency events to qualify for CPAP  titration by split protocol on the study night.   Oxygen data:  Loud snoring with oxygen desaturation to a nadir of 86%.  Mean oxygen saturation through the study was 92% on room air.   Cardiac data:  Normal sinus rhythm.   Movement/parasomnia:  No significant movement disturbance.  Bathroom  times one.   IMPRESSION/RECOMMENDATION:  1. Sleep architecture primarily note worthy for low percentage time      spent in REM.  This can reflect antidepressant therapy or simply      the unfamiliarity of the sleep center  environment.  Absent slow      wave sleep, stage III is expected on most nights in adults.  2. Occasional respiratory disturbance within normal limits.  (AHI 1.7      per hour, normal range 0 to 5 per hour).  The events were more      common as  expected while supine and in REM.  Loud snoring with      oxygen desaturation to a nadir of 86%.  3. Respiratory scores in this range are not usually addressed with      CPAP and may not require intervention.  Loud      snoring may be helped by treatment for upper airway obstruction or      congestion and encouragement to sleep off flat of back , if      appropriate.      Clinton D. Maple Hudson, MD, FCCP, FACP  Diplomate, Biomedical engineer of Sleep Medicine  Electronically Signed     CDY/MEDQ  D:  11/17/2007 11:09:13  T:  11/18/2007 11:10:55  Job:  [id number]

## 2011-04-18 NOTE — Assessment & Plan Note (Signed)
NAMEMarland Kitchen  [name], [name]                 CHART#:  [id number]   DATE:  06/02/2008                       DOB:  1956/03/10   CHIEF COMPLAINT:  Follow up abdominal pain and new concern of fecal  seepage.   PROBLEM LIST:  1. Chronic abdominal pain, which is intermittent and right-sided,      accompanied with nausea, vomiting, and diarrhea.  2. History of intermittent peripheral eosinophilia.  3. Leukocytosis and erythrocytosis, being followed by Dr. Mathis Bud at [facility].  He is status post phlebotomy.  4. History of recurrent pancreatitis in 2007 and 2008.  5. Chronic GERD.  6. Chronic back pain.  7. 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  8. Mild steatosis on previous liver biopsy.   SUBJECTIVE:  The patient is a 55 year old Caucasian male.  He has  history of chronic right-sided abdominal pain, which is intermittent,  usually accompanied with nausea, vomiting, and diarrhea.  His weight is  up 11 pounds since he was last seen by me 8 months ago.  He tells me his  appetite is good.  He has recently received a phlebotomy and has been  doing well since that time.  He is on Nexium 40 mg daily.  He rarely has  heartburn.  He describes the pain on his right side as 4/10 on pain  scale.  It is definitely worse with movement.  He generally has a bowel  movement every 2-3 days, but at times can have alternating diarrhea with  significant urgency.  He complains of some nausea when he gets abdominal  pain, but denies any vomiting.  He denies any fevers or chills.  He  tells me he has had a recent CT of the chest, abdomen and pelvis with  contrast at [facility] in [place].  He was found to have a 6-mm  nodule on the right upper lobe of his lung.  He is recommended he have a  followup chest CT in 6-12 months and otherwise normal exam.  Last CBC  from 05/26/2008, shows a white blood cell count of 9.3, hemoglobin of  14.7, hematocrit of 43.2, and platelet count of 396.  He did stop  smoking 3 months ago.   CURRENT MEDICATIONS:  1. Nexium 40 mg daily.  2. Alprazolam 0.5 mg 1-2 at bedtime.   ALLERGIES:  Codeine and hydrocodone.   OBJECTIVE:  VITAL SIGNS:  Weight 185.5 pounds, height 68 inches,  temperature 97.7, blood pressure 120/82, and pulse 80.  GENERAL:  The patient is a well-developed, well-nourished Caucasian male  in no acute distress.  HEENT:  Sclerae clear, nonicteric.  Conjunctiva pink.  Oropharynx pink  and moist without any lesions.  CHEST:  Heart, regular rate and rhythm.  Normal S1 and S2 without any  murmurs, clicks, rubs, or gallops.  ABDOMEN:  Positive bowel sounds x4.  No bruits auscultated.  Abdomen is  soft, nondistended.  He does have a positive Carnett  sign.  He does  have significant tenderness to the abdominal wall.  There is no rebound  tenderness or guarding.  No hepatosplenomegaly or mass.  RECTAL:  There is no external lesions visualized.  Good sphincter tone.  I was unable to complete internal exam because he has a significant  amount of hard stool in his rectum, which precluded the exam.  Stool was  Hemoccult negative.  EXTREMITIES:  Without edema.  He does have clubbing.   ASSESSMENT:  The patient is a 55 year old Caucasian male with history of  peripheral eosinophilia.  He has also been followed by Hem/Oncology for  leukocytosis and erythrocytosis, possible polycythemia picture.  He has  had a recent phlebotomy.  He does alternate between constipation and  diarrhea, which is suggestive of irritable bowel syndrome.  He also has  a significant amount of abdominal wall pain, and this does reproduce his  right-sided pain.  Therefore, it could be the culprit of his  intermittent symptoms, and I feel he may benefit from physical therapy,  as well as chronic constipation with fecal  seepage/overflow  incontinence.   He does have a history of intermittent peripheral eosinophilia, but  likelihood of occult malignancy is low at this point and considering the  fact that he is gaining weight and overall doing much better.  Again, he  is being followed by hem/onc.  He has history of adenomatous colonic  polyps.   PLAN:  1. Colonoscopy in 2012 for surveillance.  2. We will set him up with physical therapy in [facility] for      abdominal wall pain.  3. He is to begin Benefiber or fiber supplement of choice daily.  4. GlycoLax 17 g daily p.r.n. constipation., 527 g with two refills.  5. Office visit in 6 weeks with Dr. Jena Newman to assess his progress.       Trevor Newman, N.P.  Electronically Signed     Trevor Newman, M.D.  Electronically Signed    KJ/MEDQ  D:  06/03/2008  T:  06/03/2008  Job:  [id number]   cc:   Trevor Ped, MD  Trevor Newman, M.D.

## 2011-04-18 NOTE — Assessment & Plan Note (Signed)
NAMEMarland Newman  MAKARIOS, MADLOCK                 CHART#:  [id number]   DATE:  10/01/2007                       DOB:  04/04/1956   CHIEF COMPLAINT:  Right-sided abdominal pain.   SUBJECTIVE:  The patient is a 55 year old Caucasian male, he was last  seen on July 31, 2007 by Dr. Jena Gauss.  He has a history of right-sided  abdominal pain, nausea and vomiting.  He has history of malrotated small  bowel and status post duodenojejunostomy.  He was felt to be having  intermittent partial small bowel obstruction.  He also has history of  peripheral eosinophilia and was felt to be secondary to  gastroenteritis.  He has been on prednisone previously.  He has also  been evaluated by Dr. Jacqulyn Bath over at [facility].  He  had a liver biopsy which demonstrated mild steatosis.  He had  colonoscopy on September 17, 2006, was found to have a 5-mm polyp in the  sigmoid colon which was adenomatous.  He had internal hemorrhoids,  normal TI.  EGD in 2004 demonstrated mild changes of reflux esophagitis.  He had an empiric dilatation.  There was no evidence of gastroenteritis  at that time.  He tells me today that he continues to have right-sided  abdominal pain, is around his right mid to right lower quadrant.  Currently he complains of nausea, denies any vomiting.  He has  occasional bouts of diarrhea and a couple of times a month he will have  a severe episode, will develop abdominal pain and diarrhea.  He denies  any rectal bleeding or melena.  He tells me his symptoms are usually  worse at night.  He denies any fever or chills.  He has history of  severe arthritis.  Last CBC shows absolute eo count of 900/8%, he had  normal hemoglobin and hematocrit.   CURRENT MEDICATIONS:  See updated list from October 01, 2007.   ALLERGIES:  CODEINE AND HYDROCODONE CAUSED RASH.   OBJECTIVE:  VITAL SIGNS:  Weight 174 pounds, height 5 feet 8 inches,  temperature 97.6, blood pressure 118/82 and pulse 80.  GENERAL:   The patient is a well-developed, well-nourished Caucasian male  in no acute distress.  HEENT:  Sclerae are clear,  non-icteric.  Conjunctivae pink.  Oropharynx  pink and moist without any lesions.  CHEST:  Heart regular rate and rhythm, normal S1-S2.  ABDOMEN:  Positive bowel sounds x4, he has well-healed surgical scars,  abdomen is soft, nondistended without palpable mass or  hepatosplenomegaly.  He does have mild tenderness to the right lower  quadrant on deep palpation, there is no rebound tenderness or guarding.  EXTREMITIES:  Without clubbing or edema bilaterally.  SKIN:  Pink, warm and dry without any rash or jaundice.   IMPRESSION:  The patient is a 55 year old gentleman who has intermittent  bouts of right-sided abdominal pain along with nausea, vomiting and  diarrhea.  He has had an extensive evaluation both here and at [facility].  He does have peripheral eosinophilia  intermittently as well.  He did seem to have some relief status post  laparotomy for duodenojejunostomy for partial small bowel obstructions  and malrotated small bowel.  He is status post cholecystectomy, he has  history of mildly elevated liver function tests and mild steatosis on  liver biopsy.  He has significant arthritis and I have discussed this  with Dr. Jena Gauss.  Given his persistent eosinophilia, my concern is that  we may be dealing with systemic lupus, or occult malignancy, and I think  this warrants further evaluation.   PLAN:  1. Begin prednisone 30 mg daily.  Will begin to taper in 2-3 weeks.      Check a sed rate.  2. Rheumatology appointment.  3. Tylenol PM for his insomnia.  4. Colonoscopy in 2012.       Lorenza Burton, N.P.  Electronically Signed     R. Roetta Sessions, M.D.  Electronically Signed    KJ/MEDQ  D:  10/03/2007  T:  10/03/2007  Job:  [id number]   cc:   Ramon Dredge L. Juanetta Gosling, M.D.

## 2011-04-18 NOTE — H&P (Signed)
NAME:  JONATHON, TAN                ACCOUNT NO.:  [id number]   MEDICAL RECORD NO.:  [id number]          PATIENT TYPE:  AMB   LOCATION:  DAY                           FACILITY:  [facility]   PHYSICIAN:  R. Roetta Sessions, M.D. DATE OF BIRTH:  [birth year]   DATE OF ADMISSION:  DATE OF DISCHARGE:  LH                              HISTORY & PHYSICAL   HISTORY AND PHYSICAL EXAMINATION:  Recurrent esophageal dysphagia.   HISTORY OF PRESENT ILLNESS:  Mr. Meiklejohn is a pleasant 55 year old  gentleman with long-standing gastroesophageal reflux disease symptoms.  He reports vague intermittent recurrent esophageal dysphagia to solids  and occasionally pills.  EGD back in 2004 by Dr. Loreta Ave demonstrated  reflux esophagitis.  He did not have an obstructing lesion; however, a  55-[place] Maloney dilator passed empirically.  He has had peripheral  eosinophilia and leukocytosis and polycythemia for which he has been  evaluated at [facility] by Dr. Abbe Amsterdam.  The peripheral  eosinophilia has been ongoing.  In 2007, he had his esophagus biopsied.  There was no evidence of eosinophilic esophagitis.  He has had a several-  year history of chronic right-sided abdominal pain.  He is status post  cholecystectomy.  He is found to have malrotation of the small bowel for  which Dr. Lovell Sheehan performed surgery.  He had an episode of epigastric  pain, nausea, and vomiting, the last of which was transient abdominal  distention.  Even though he takes Benefiber daily and MiraLax, he  perceived constipation with no bowel movement for 3 days last week.  Prior colonoscopy demonstrated a small adenoma.  This was done in  October 2007 for hematochezia.   He has been seen by Dr. Mellody Memos in tertiary consultation over [facility] with regards to his abdominal pain.  They had  no specific impression as to the cause of his pain and noted he was  status post appendectomy, cholecystectomy, and status post  duodenojejunostomy.  They entertained possibility of abdominal wall pain  and tried him on some Nexium and Bentyl.   PAST MEDICAL HISTORY:  Also except for history of elevated liver enzymes  for which he underwent liver biopsy at the time of cholecystectomy back  in 2007, he was found to have mild steatosis at that time.   CURRENT MEDICATIONS:  1. Nexium 40 mg orally daily.  2. Alprazolam 0.5 mg 2 tablets at bedtime.  3. MiraLax 70 grams orally daily.  4. Benefiber one dose daily.  5. Milk of magnesia p.r.n.   ALLERGIES:  CODEINE and HYDROCODONE.   FAMILY HISTORY:  No problems with GI or liver illness.  Father deceased  in his 45s secondary to lung carcinoma, he was a smoker.  Mother is in  good health.  Five brothers, all of whom are healthy.   SOCIAL HISTORY:  The patient is disabled.  He was previously a Ecologist.  Prior history of smoking.  One healthy son, who is 75 years old.   REVIEW OF SYSTEMS:  No recent chest pain or dyspnea on exertion.  No  melena.  No hematochezia.  Recent constipation is self-limited, as I  described above.   PHYSICAL EXAMINATION:  GENERAL:  A pleasant 55 year old gentleman and  resting comfortably.  VITAL SIGNS:  Weight 185, height 5 feet and 8 inches, temperature 97.4,  BP 120/78, and pulse 64.  SKIN:  Warm and dry.  There is no jaundice.  HEENT:  No scleral icterus.  Conjunctivae are pink.  CHEST:  Lungs are clear to auscultation.  HEART:  Regular rate and rhythm without murmur, gallop, or rub.  ABDOMEN:  Nondistended, positive bowel sounds, and soft.  He does have  some epigastric tenderness to palpation, and this is overlying what  appears to be an incisional hernia in the superior aspect of his  laparotomy from prior small bowel surgery.  There is no bowel contained  within it, but it is tender.  Otherwise, the abdomen is soft without  appreciable mass or hepatosplenomegaly.  EXTREMITIES:  No edema.   IMPRESSION:  Mr. Yosiel Thieme is  a pleasant 55 year old gentleman who  really has multiple medical problems, who has a long-standing  gastroesophageal reflux disease now.  He has recurrent esophageal  dysphagia.  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 Abbe Amsterdam.  He has derived improvement from  having, as far as his dysphagia symptoms are concerned with esophageal  dilation previously.  At this point in time, I have offered him a repeat  EGD with esophageal dilation, but also consider doing biopsies of upper  GI tract just to see if we can at all document an element of  eosinophilic gastroenteritis.  I discussed this approach with Mr. Stettler.  Risks, benefits, alternatives, and limitations have been reviewed,  questions answered, and he is agreeable.  We will plan to perform an EGD  with possible esophageal dilation and biopsies as appropriate in the  very near future at [facility].  Further recommendations to  follow.      Jonathon Bellows, M.D.  Electronically Signed     RMR/MEDQ  D:  07/17/2008  T:  07/18/2008  Job:  [id number]   cc:   [facility]   Mathis Bud, MD  [facility]

## 2011-04-18 NOTE — Assessment & Plan Note (Signed)
NAMEMarland Kitchen  RORAN, WEGNER                 CHART#:  [id number]   DATE:  07/31/2007                       DOB:  March 01, 1956   Follow-up right side abdominal pain.  History of right sided abdominal  pain, nausea and vomiting.   Mr. Keimig has had a significant time with right sided abdominal pain,  nausea and vomiting over the past year or so.  Most recently he was  found to have malrotated small bowel on CT.  Dr. Lovell Sheehan ultimately  performed a duodeno jejunostomy.  Operative findings included apparent  vascular anatomy going to the small bowel.  It is felt that he is having  intermittent partial small bowel obstruction.  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  He has been on prednisone  but at this point in time he is 7 months out from surgery and reports no  doubt that the surgery Dr. Lovell Sheehan performed has been associated with  marked and dramatic improvement in the above mentioned symptoms.  He  formally underwent a cholecystectomy for what was felt to be symptomatic  gallbladder disease.  Cholangiogram was negative at that time.  Liver  biopsy demonstrated mild steatosis.  He has been seen by Dr. Jacqulyn Bath over  at [facility].  As recently as July of this year he basically  recommended we continue treating his reflux with Nexium (which has  controlled his reflux symptoms fairly well). He is not having any nausea  or vomiting.  Right sided abdominal pain is subsiding.  He is having 1-2  bowel movements daily.  No melena or rectal bleeding.  He lastly had a  colonoscopy with removal of a small adenoma performed by Dr. Julian Reil for  hematochezia.  On 09/17/06 he was found to have a 5 mm polyp in the  sigmoid which turned out to be an adenoma, also internal hemorrhoids.  Normal terminal ileum.   Prior EGD in 2004 demonstrated mild changes in reflux esophagitis.  Is  having some dysphagia but no  obstructive lesion was found, 56 [place]  Maloney dilator was passed empirically.  The esophagus was biopsied  again in 2007. There was no evidence of eosinophilic gastroenteritis at  that time.  He has been having some atypical chest pain.  He was slated  to have esophageal manometry but could not tolerate passage of the  manometry catheter.  He has had some bilateral leg pain and is planning  to see Dr. Juanetta Gosling in the near future.  Overall his GI symptoms are  quiescent and he is pleased with his progress since laparotomies from  Dr. Lovell Sheehan the first of this year.  His only medication at this time is  Nexium 40 mg orally daily.  He is allergic to codeine, hydrocodone and  possibly some other pain medication.  Today he looks well, in no acute  distress.  His weight is down 2 pounds since 12/18/06, 170 pounds.  Height 5 feet 8 inches, temperature 98, blood pressure 102/78, pulse 64.  Skin:  Warm and dry.  There is no jaundice.  Chest:  Lungs are clear to  auscultation. Cardiovascular:  Regular rate and rhythm without murmur,  gallop or rub.  Abdomen:  Has well healed surgical scars, nondistended.  Positive  bowel sounds.  Soft, essentially no tenderness to deep  palpation.  No appreciable mass or organomegaly.   ASSESSMENT:  1. Mr. Tokunaga is a 55 year old gentleman who has been through quite an      extensive evaluation over the past year or so for right sided      abdominal pain.  Peripheral eosinophilia noted but he really did      not get improvement in his symptoms until he underwent a laparotomy      and underwent surgical treatment of his malrotated bowel which is      likely producing at least a partial functional obstruction.  2. 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  He is also status post      appendectomy. History of a sigmoid adenoma removed in 2007.  3. History of mildly elevated LFTs and mild steatosis on labs      previously.  Recommendations at this point in time are  reviewed      with antireflux measure/diet.  He is to continue Nexium 40 mg      daily.  I have given him samples and re prescription. Will check a      chem 20, CBC with differential see where we stand.  4. As far as adenoma surveillance is concerned I would recommend he      return in 2012 for repeat colonoscopy.  Will arrange interim follow-      up depending on how the labs look when they become available for      review.   Very pleased to see this gentleman's progress.  He is pleased as well.  He has been through quite an ordeal recently.   I have again recommended he follow-up with Dr. Juanetta Gosling in reference to  his leg pain.       Jonathon Bellows, M.D.  Electronically Signed     RMR/MEDQ  D:  07/31/2007  T:  08/01/2007  Job:  [id number]   cc:   Ramon Dredge L. Juanetta Gosling, M.D.

## 2011-04-21 NOTE — H&P (Signed)
NAMEMarland Kitchen  Trevor Newman NO.:  [id number]   MEDICAL RECORD NO.:  [id number]          PATIENT TYPE:  IPS   LOCATION:  0407                          FACILITY:  [facility]   PHYSICIAN:  Jeanice Lim, M.D. DATE OF BIRTH:  Jul 15, 1956   DATE OF ADMISSION:  12/09/2004  DATE OF DISCHARGE:                         PSYCHIATRIC ADMISSION ASSESSMENT   This is a 55 year old widowed white male, admitted voluntarily to the  services of Jeanice Lim, M.D.   The patient's wife died of cancer 3 years ago.  The holidays were rough.  He  says that his worst problem at this time is not sleeping.  He is currently  living with his 43 year old mother.  He was here recently about 2-3 months  ago.  He was an inpatient here.  Actually, he states this is his third or  fourth admission to [facility].  He has been to  [facility] x1.  He is followed on an outpatient basis at  [facility].   SOCIAL HISTORY:  He has a GED.  He used to drive a Multimedia programmer.  He is  receiving Social Security disability for depression, which began after the  death of his wife.   FAMILY HISTORY:  He denies.   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   MEDICAL HISTORY AND PRIMARY CARE Trevor Newman:  [facility].   MEDICAL PROBLEMS:  He was evaluated in the emergency room on December 06, 2004.  He was found to have noncardiogenic chest pain.  He was told that he  may have the beginnings of an ulcer.  He is on Protonix.   CURRENTLY PRESCRIBED MEDICATIONS:  1.  Wellbutrin XL 300 mg p.o. q. a.m.  2.  Seroquel 300 mg at h.s.  3.  Ambien 10 mg at h.s.   DRUG ALLERGIES:  CODEINE.   POSITIVE PHYSICAL EXAMINATION:  As per the chart.  He has a work-up for his  chest pain, which was negative.  He is not known to have any other problems.   MENTAL STATUS EXAMINATION:  Alert and oriented x3.   Appearance and behavior:  Appropriately groomed and dressed.  Speech somewhat slow.  Mood depression.  Affect was flat.  Thought processes are clear and linear.  Cognitive:  Judgment and insight are poor.  Concentration and memory are intact.  Intelligence is average.  Today, he is denying suicide ideation.  He just  does not want to do anything.  He has no motivation.  He still hears mumbles  and flashes go by his eyes.   AXIS I:  Depression with psychotic features, specifically auditory and  visual hallucinations.  He has not used cocaine, according to him, for at  least 2 months.   AXIS II:  Deferred.   AXIS III:  Noncardiac chest pain.   AXIS IV:  Grief and support issues.   AXIS V:  Global assessment of functioning 35.   PLAN:  Admit for stabilization and safety, to  adjust his medications as  indicated, and to help identify post-discharge activities that will get him  back into social contact.      MD/MEDQ  D:  12/10/2004  T:  12/10/2004  Job:  [id number]

## 2011-04-21 NOTE — Op Note (Signed)
Trevor Newman, DESROCHES NO.:  [id number]   MEDICAL RECORD NO.:  [id number]          PATIENT TYPE:  AMB   LOCATION:  DAY                           [facility]  [facility]   PHYSICIAN:  Dalia Heading, M.D.  DATE OF BIRTH:  [birth year]   DATE OF PROCEDURE:  10/19/2006  DATE OF DISCHARGE:                                 OPERATIVE REPORT   PREOPERATIVE DIAGNOSIS:  Cholecystitis, cholelithiasis.   POSTOPERATIVE DIAGNOSIS:  Cholecystitis, cholelithiasis.   PROCEDURE:  Laparoscopic cholecystectomy with cholangiograms, liver biopsy.   SURGEON:  Dr. Franky Macho   ANESTHESIA:  General endotracheal.   INDICATIONS:  The patient is a 55 year old white male who presents with  right upper quadrant abdominal pain.  Ultrasound of the gallbladder reveals  possible thickened gallbladder wall with cholelithiasis.  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  He is on prednisone.  The patient now comes to the operating for  laparoscopic cholecystectomy with cholangiogram, possible liver biopsy.  The  risks and benefits of the procedures including bleeding, infection,  hepatobiliary injury, the possibility of an open procedure were fully  explained to the patient, gave informed consent.   PROCEDURE NOTE:  The patient was placed in the supine position.  After  induction of general endotracheal anesthesia, the abdomen was prepped and  draped using the usual sterile technique with Betadine.  Surgical site  confirmation was performed.   A supraumbilical incision was made down to the fascia.  A Veress needle was  introduced into the abdominal cavity and confirmation and placement was done  using the saline drop test.  The abdomen was then insufflated to 16 mmHg  pressure.  An 11 mm trocar was introduced into the abdominal cavity under  direct visualization without difficulty.  The patient was placed in reverse  Trendelenburg position.   An additional 11-mm trocar was placed in the  epigastric region, and 5-mm trocars were placed in the right upper quadrant  right flank regions.  Liver was inspected, noted to be within normal limits.  The gallbladder was retracted superior and laterally.  The dissection was  begun around the infundibulum of the gallbladder.  The cystic duct was first  identified.  Its juncture to the infundibulum fully identified.  A single  Endo clip was placed proximally on the cystic duct.  Cholangiocatheter was  then inserted into the cystic duct, and cholangiograms were performed under  digital fluoroscopy.  The cholangiocatheter was in the cystic duct.  The  hepatobiliary tree was within normal limits.  No abnormal filling defects  were noted.  The dye flowed freely into the duodenum.  The system was then  flushed with saline.  The cholangiocatheter was removed and multiple Endo  clips placed distally on the cystic duct and cystic duct was divided.  The  cystic artery was likewise ligated and divided.  The gallbladder was then  freed away from the gallbladder fossa using Bovie electrocautery.  The  gallbladder was delivered through the epigastric trocar site using an  EndoCatch bag.  Two Tru-Cut liver biopsies were performed in close proximity  to the gallbladder fossa.  These were sent to pathology for further  examination.  Intraoperative consultation from Dr. Karilyn Cota was obtained.  The  liver was noted to be somewhat nodular in nature.  No discrete masses were  noted.  Surgicel was then placed in the gallbladder fossa.  All fluid then  evacuated from the abdominal cavity prior to removal of the trocars.   All wounds were irrigated with normal saline.  All wounds were checked with  0.5% Sensorcaine.  The supraumbilical fascia was reapproximated using an 0  Vicryl interrupted suture.  All skin incisions were closed using staples.  Betadine, ointment, and dry sterile dressings were applied.   All  tape and needle counts were correct at the end of the procedure.  The  patient was extubated in the operating room and went back to recovery room  awake in stable condition.  Complications none.   SPECIMEN:  Gallbladder, liver biopsy.   BLOOD LOSS:  Minimal.      Dalia Heading, M.D.  Electronically Signed     MAJ/MEDQ  D:  10/19/2006  T:  10/19/2006  Job:  [id number]   cc:   Ramon Dredge L. Juanetta Gosling, M.D.  Fax: [phone]   Lionel December, M.D.  [address]  [place]  [place] [address]

## 2011-04-21 NOTE — H&P (Signed)
NAME:  Trevor Newman, Trevor Newman NO.:  [id number]   MEDICAL RECORD NO.:  [id number]          PATIENT TYPE:  AMB   LOCATION:  DAY                           [facility]  [facility]   PHYSICIAN:  Dalia Heading, M.D.  DATE OF BIRTH:  12-Feb-1956   DATE OF ADMISSION:  DATE OF DISCHARGE:  LH                              HISTORY & PHYSICAL   CHIEF COMPLAINT:  Partial malrotation of small bowel.   HISTORY OF PRESENT ILLNESS:  The patient is a 55 year old white male who  presents with a recurrent episode of right-sided abdominal pain and  bloating.  He also has some nausea and vomiting.  In the past, he has  been treated for eosinophil gastritis.  He is status post laparoscopic  cholecystectomy with cholangiograms and liver biopsy in November 2007  who was now found on upper GI series to have a partial malrotation of  the small bowel.  He does have evidence of an obstructive process  between the third and fourth portions of the duodenum.  His mother was  treated for malrotation in the remote past.   PAST MEDICAL HISTORY:  As noted above, reflux disease.   PAST SURGICAL HISTORY:  As noted above.   CURRENT MEDICATIONS:  Nexium, prednisone, Darvocet-N 100, nitroglycerin  p.r.n.   ALLERGIES:  No known drug allergies.   REVIEW OF SYSTEMS:  The patient does smoke.  Denies any alcohol use.   On physical examination, the patient is a well-developed, well-nourished  white male in no acute distress.  LUNGS:  Clear to auscultation with equal breath sounds bilaterally.  HEART EXAMINATION:  Reveals a regular rate and rhythm without S3, S4, or  murmurs.  The abdomen is soft with minimal tenderness or distension noted.  No  hepatosplenomegaly or masses are noted.   White blood cell count 15.7, hematocrit 48, platelet count 450,000.  MET7 is within normal limits.   IMPRESSION:  Partial malrotation of small bowel.   PLAN:  The patient is scheduled for exploratory laparotomy with probable  lysis of adhesions on December 31, 2006.  He may undergo an appendectomy  should his appendix be in an abnormal position.  Risks and benefits of  the procedure including bleeding, infection, recurrence of the symptoms  were fully explained to the patient, who gave informed consent.      Dalia Heading, M.D.  Electronically Signed     MAJ/MEDQ  D:  12/28/2006  T:  12/28/2006  Job:  [id number]   cc:   Dalia Heading, M.D.  Fax: [phone]   Oneal Deputy. Juanetta Gosling, M.D.  Fax: [phone]   Lionel December, M.D.  [address]  [place]  [place] [address]   [facility]  Fax: [phone]

## 2011-04-21 NOTE — Discharge Summary (Signed)
   NAME:  DAVIEL, ALLEGRETTO NO.:  [id number]   MEDICAL RECORD NO.:  [id number]                   PATIENT TYPE:  INP   LOCATION:  A328                                 FACILITY:  [facility]   PHYSICIAN:  Vania Rea, M.D.              DATE OF BIRTH:  1956-02-25   DATE OF ADMISSION:  10/03/2003  DATE OF DISCHARGE:  10/07/2003                                 DISCHARGE SUMMARY     ___________________________________________                                         Vania Rea, M.D.   LC/MEDQ  D:  10/07/2003  T:  10/07/2003  Job:  [id number]

## 2011-04-21 NOTE — H&P (Signed)
NAME:  Trevor Newman, MEY NO.:  [id number]   MEDICAL RECORD NO.:  [id number]                   PATIENT TYPE:  EMS   LOCATION:  ED                                   [facility]ACILITY[facility]  [facility]   PHYSICIAN:  Melvyn Novas, MD        DATE OF BIRTH:  February 24, 1956   DATE OF ADMISSION:  10/03/2003  DATE OF DISCHARGE:                                HISTORY & PHYSICAL   The patient is a 55 year old white male who awoke this morning complaining  of right flank pain which was stabbing in nature and this was not  associated with any nausea or vomiting, diarrhea, hematemesis or melena.  The pain persisted off and on throughout the day.  He was seen in the ER and  worked up for nephrolithiasis.  A CT urogram was negative.  He had no  hematuria in the urine.  He is status post appendectomy.  Labs were drawn  and his amylase and lipase were moderately elevated.  He denied any  epigastric pain but does have some right lower quadrant discomfort upon  palpation and some guarding.  The patient is admitted for possible right  sided diverticulitis.  There is no significant right upper quadrant pain but  sonogram of the biliary tract is ordered.   PAST MEDICAL HISTORY:  Significant for:  1. Heart disease.  2. Depression.   PAST SURGICAL HISTORY:  1. History remarkable for appendectomy.  2. Left shoulder rotator cuff surgery.   SOCIAL HISTORY:  He smokes one pack per day for 30 years.  He does not  imbibe alcohol for six months.  He is widowed, unemployed with no health  insurance.   CURRENT MEDICATIONS:  1. Lexapro 30.  2. Seroquel 100 h.s.  3. Sublingual nitroglycerin p.r.n.   PHYSICAL EXAMINATION:  VITAL SIGNS:  Blood pressure is 128/74, temperature  is 98.1, pulse is 70 regular, respiratory rate is 20, 99% sat.  HEENT:  Head, normocephalic, atraumatic.  Eyes, PERRLA.  Extraocular  movements intact.  Sclera are clear.  Conjunctivae pink.  NECK:  Shows no JVD, no  carotid bruits, no thyromegaly, no thyroid bruits.  LUNGS:  Show increased expiratory phase.  No rales, wheezes or rhonchi  appreciable.  HEART:  Regular rhythm.  No S3, S4, gallop.  No heaves, thrills or rubs.  No  murmurs appreciable.  ABDOMEN:  Positive right lower quadrant tenderness and guarding with some  right CVA tenderness to deep palpation.  Murphy's sign is negative.  Bowel  sounds are normoactive.  There is no rebound tenderness.  EXTREMITIES:  No clubbing, cyanosis or edema.  NEUROLOGIC:  Cranial nerves II-XII are grossly intact.  Patient moves all  four extremities.   IMPRESSION:  1. Right lower quadrant, right flank pain, status post appendectomy.  2. Question of right sided diverticulitis.  3. Doubt cholecystitis.  4. Increased amylase and lipase.   PLAN:  Admit for IV Levaquin, IV  narcotic analgesia, clear liquid diet only,  serial amylase, lipase and liver profile on Monday, sonogram of the biliary  tract and I will make further recommendations as the database expands.     ___________________________________________                                         Melvyn Novas, MD   RMD/MEDQ  D:  10/03/2003  T:  10/03/2003  Job:  [id number]

## 2011-04-21 NOTE — H&P (Signed)
NAME:  Trevor Newman, Trevor Newman NO.:  [id number]   MEDICAL RECORD NO.:  [id number]                   PATIENT TYPE:  IPS   LOCATION:  0505                                 [facility]ILITY[facility]  [facility]   PHYSICIAN:  Jeanice Lim, M.D.              DATE OF BIRTH:  09-07-1956   DATE OF ADMISSION:  07/06/2004  DATE OF DISCHARGE:                         PSYCHIATRIC ADMISSION ASSESSMENT   IDENTIFYING INFORMATION:  This is a 55 year old widowed white male  voluntarily admitted on July 07, 2004.   HISTORY OF PRESENT ILLNESS:  The patient presents with a history of suicidal  thoughts for at least a week with no specific plan.  He has been using crack  cocaine for the past two years with his last use on Wednesday prior to this  admission.  The patient states that he started using cocaine after his wife  died.  She had passed away about two years ago.  Has been binging for four  days with no sleep and decreased appetite.  States he has had a five-pound  weight loss.  The patient also hearing voices telling him to hurt himself  but he feels safe at this time.  The patient states he wants to go to rehab  program and get himself feeling better.  He reports positive racing thoughts  and positive cravings for cocaine.   PAST PSYCHIATRIC HISTORY:  First admission to [facility].  He  sees Dr. Thomasena Edis at mental health.  He was in [facility] six months ago  for suicidal thoughts and then overdosed on Xanax and other medications.   SOCIAL HISTORY:  This is a 55 year old widowed white male.  His wife passed  away two years ago from cancer.  He lives with his mother at this present  time.  He is on disability.  No legal problems.  He has no children.   FAMILY HISTORY:  Father and brother with alcohol problems.   ALCOHOL/DRUG HISTORY:  The patient smokes.  He denies any alcohol use.  Using crack cocaine for two years on a daily basis.  Denies any IV drug use.  Denies any  other substances.   PRIMARY CARE PHYSICIAN:  The patient goes to the health department.   MEDICAL PROBLEMS:  None.  The patient did have a chest pain workup in the  past where he was put on a stress test but was told everything was fine.   MEDICATIONS:  Wellbutrin XL 150 mg, Seroquel 100 mg at bedtime (which he  states is not effective).  Has been on Lexapro in the past.   ALLERGIES:  CODEINE.   PHYSICAL EXAMINATION:  The patient was assessed at [facility] prior  to this admission where he had a normal EKG.  Temperature 96.2, heart rate  97, respirations 20, blood pressure 118/80, 155 pounds, 5 feet 7 inches  tall.  The patient, on day of admission, was having  some chest pain and had  to be sent back to assessment of his chest pain.   LABORATORY DATA:  Urine drug screen was positive for THC, positive for  cocaine.  WBC 11.1, potassium 3.3.  Alcohol level less than 5.   MENTAL STATUS EXAM:  He is an alert, middle-aged male, cooperative.  Fair  eye contact.  Speech is clear.  Mood is depressed.  Affect is flat.  Appears  sad over substances.  He is very concerned about his mother who has been  through quite a bit with him.  Thought processes are coherent.  There is no  evidence of psychosis.  Cognitive function is intact.  Memory is fair.  Judgment is fair.  Insight is limited.  Poor impulse control.   DIAGNOSES:   AXIS I:  1. Depressive disorder not otherwise specified.  2. Cocaine abuse; rule out dependence.  3. Rule out bipolar disorder.   AXIS II:  Deferred.   AXIS III:  None.   AXIS IV:  Problems with housing, other psychosocial problems related to  medical problems.   AXIS V:  Current 30; estimated this past year 60-65.   PLAN:  Admission for depression and suicidal thoughts and cocaine abuse.  Contract for safety.  Will continue his antidepressant.  Will change his  Seroquel to Zyprexa at bedtime for racing thoughts.  Have Symmetrel for  cravings.  The  patient is to drink fluids, work on relapse and increase his  coping skills by attending individual and group therapy.  Casemanager is to  look at a rehab program.   TENTATIVE LENGTH OF STAY:  Four to five days.     Landry Corporal, N.P.                       Jeanice Lim, M.D.    JO/MEDQ  D:  07/08/2004  T:  07/08/2004  Job:  [id number]

## 2011-04-21 NOTE — Consult Note (Signed)
NAME:  Trevor Newman, Trevor Newman                          ACCOUNT NO.:  [id number]   MEDICAL RECORD NO.:  [id number]                   PATIENT TYPE:  INP   LOCATION:  A328                                 FACILITY[facility]  [facility]   PHYSICIAN:  Lionel December, M.D.                 DATE OF BIRTH:  [birth year]   DATE OF CONSULTATION:  10/06/2003  DATE OF DISCHARGE:                                   CONSULTATION   REFERRING PHYSICIAN:  Ara D. Tammi Klippel, M.D., [facility].   REASON FOR CONSULTATION:  Dysphagia, odynophagia.   HISTORY OF PRESENT ILLNESS:  The patient is a 55 year old white male with  history of depression who was admitted to the hospital with complaint of  right flank pain, nausea, and vomiting.  The pain was noted to be primarily  in the right flank area.  He described it as stabbing-like.  It was 10/10 in  severity.  He states he had some vomiting initially with it.  At time of  admission, he was noted to have an elevated amylase of 144 and lipase of  139.  LFTs were normal.  White count 11,000.  Urinalysis was negative.  It  was felt at first he may have nephrolithiasis.  He had a CT urogram which  was negative.  He has subsequently undergone abdominal ultrasound which  revealed no gallstones or thickening.  Common bile duct is 7 mm, no  intrahepatic biliary dilatation.  Head of pancreas appears slightly  prominent with a vague area of hypoechogenicity measuring 2.4 cm.  Yesterday  his amylase was normal and lipase was 53.  LFTs remained normal.  The  patient is now complaining of epigastric pain and right upper quadrant and  right mid abdominal pain.  Denies any further nausea.  Generally moves his  bowels regularly but has not had a bowel movement in three days.  Denies any  melena or rectal bleeding.  Denies any heartburn.  He states that he has for  the last four weeks problems swallowing solid foods.  Has pain associated  with this.  He lost 10 pounds in the last 10 months.  He denies  any  hematuria, dysuria, or urinary hesitancy.   The patient had an upper endoscopy by Dr. Karilyn Cota in February 2002 which  revealed a small sliding hiatal hernia, serrated GE junction, gastritis with  petechiae, and a single antral erosion.  H. pylori serologies were negative.  He underwent empiric dilatation with a 54 and 58-French Maloney dilator with  relief of his dysphagia.   MEDICATIONS PRIOR TO ADMISSION:  1. Lexapro 30 mg q.h.s.  2. Seroquel 100 mg q.h.s.  3. Nitroglycerin sublingual p.r.n.   ALLERGIES:  CODEINE.  He has developed a rash with DILAUDID use during this  hospitalization.   PAST MEDICAL HISTORY:  1. Depression.  2. History of upper endoscopy as outlined above.  3. We have seen him  in the office as well for non-cardiac chest pain.  He     did have Cardiolite stress test which revealed EF of 45% but otherwise     unremarkable.  The patient has been going to the health department with     chest pain and has been given nitroglycerin.   PAST SURGICAL HISTORY:  1. Appendectomy.  2. Rotator cuff repair.   FAMILY HISTORY:  Father died of metastatic esophageal cancer at age 62.  Mother is healthy.   SOCIAL HISTORY:  He is widowed.  His wife died of lung cancer at age 15 last  year.  He is unemployed.  He smokes one pack of cigarettes daily, smoked for  over 30 years.  He never has been a heavy drinker, has had no alcohol in the  last five months.   REVIEW OF SYSTEMS:  Please see HPI for GI, GU, GENERAL, CARDIOPULMONARY.   PHYSICAL EXAMINATION:  VITAL SIGNS:  Weight 142.4 pounds, height 5 feet 8  inches.  Temperature 97.2, pulse 64, respirations 20, blood pressure 103/55.  GENERAL: Pleasant, well-nourished, well-developed, Caucasian male in no  acute distress.  SKIN:  Warm and dry, no jaundice.  HEENT:  Conjunctivae pink, sclerae nonicteric.  Oropharyngeal mucosa moist  and pink.  No lesion, erythema, or exudate.  NECK:  No lymphadenopathy, thyromegaly.   CHEST:  Lungs clear to auscultation.  CARDIAC:  Regular rate and rhythm.  Normal S1 and S2.  No murmurs, rubs, or  gallops.  ABDOMEN:  Positive bowel sounds.  Soft, nondistended.  He has moderate  epigastric, right upper quadrant, and right mid abdominal pain to deep  palpation.  No rebound tenderness or guarding.  He also has right flank pain  but no CVA tenderness.  EXTREMITIES:  No edema.   LABORATORY DATA:  As mentioned in HPI.  Last LFTs revealed total bilirubin  0.5, alkaline phosphatase 46, SGOT 14, SGPT 13, albumin 3.1. Today his  sodium is 138, potassium 3.6, BUN 4, creatinine 1, glucose 85.   IMPRESSION:  The patient is a 54 year old Caucasian gentleman.   1. Dysphagia and odynophagia with history of dysphagia in the past which     responded to empiric esophageal dilatation as outlined above.  He     possibly has esophageal stricture, although given his history of tobacco     use and family history of esophageal cancer in his father, he would be at     risk for esophageal cancer.  2. Epigastric pain, right upper quadrant, right mid abdomen, right flank and     abdominal pain.  Questionable pancreatic head mass on ultrasound.     Questionable pancreatitis with elevated amylase and lipase.  CT with     contrast is pending.  We can rule out peptic ulcer disease with endoscopy     tomorrow.  3. Rash, probably secondary to DILAUDID.   SUGGESTIONS:  1. Full liquids.  2. EGD tomorrow.  3. Will discontinue Dilaudid and begin Nubain.  4.     LFT, CBC, and potassium in the morning.  5. Follow up CT results when available.   Would like to thank Dr. Tammi Klippel for allowing [place] to take part in the care of  this patient.     ________________________________________  ___________________________________________  Tana Coast, P.A.                         Lionel December, M.D.   LL/MEDQ  D:  10/06/2003  T:  10/06/2003  Job:  [id number]   cc:   Ara D. Tammi Klippel, M.D. Fax: [phone]

## 2011-04-21 NOTE — Discharge Summary (Signed)
Trevor Newman, DEMAN NO.:  [id number]   MEDICAL RECORD NO.:  [id number]          PATIENT TYPE:  OBV   LOCATION:  A218                          FACILITY:  [facility]   PHYSICIAN:  Hanley Hays. Dechurch, M.D.DATE OF BIRTH:  05-31-1956   DATE OF ADMISSION:  05/14/2006  DATE OF DISCHARGE:  06/13/2007LH                                 DISCHARGE SUMMARY   DIAGNOSES:  1.  Odynophagia, etiology unclear, possible esophageal spasm.  2.  TT syndrome.  3.  Gastroesophageal reflux.  4.  History of depression.  5.  History of tobacco abuse.   DISPOSITION:  The patient is discharged to home.   FOLLOWUP:  Dr. Karilyn Cota, to be arranged for esophageal monometry.   DISCHARGE MEDICATIONS:  1.  Nexium 40 mg daily.  2.  Relafen 750 mg b.i.d. as needed for chest wall pain with food.  The      patient is instructed to discontinue if any increased heartburn, reflux      symptoms, chest pain, nausea, vomiting, etc.   SPECIAL INSTRUCTIONS:  The patient was counseled on smoking cessation.   HOSPITAL COURSE:  The patient is a 55 year old gentleman who presented to  the emergency room complaining of chest pain.  More specifically it was what  he described as severe odynophagia.  Further history:  The patient states  that it had been going on for months to years and intermittently worse at  times.  He was recently seen at [facility] for same.  The  patient was noted at that time to have eosinophilia.  He had no allergic  symptoms.  He did describe some chest wall tenderness and indeed did have  very discreet eosinophilia.  He had an upper GI swallow which did not reveal  any obstruction and was placed on Imdur and scheduled to follow up with the  health department; however, because his symptoms were not at all improved  and the Imdur gave him a significant headache, he presented to our ER.  He  was admitted to the hospital and ruled out with serial enzymes.  It was felt  that this  was atypical chest pain and GI consultation was obtained.  The  patient underwent endoscopy which revealed a small hiatal hernia and  Schatzki's ring which was dilated.  He had mild reflux esophagitis.  His  sedimentation rate was 2.  Biopsies were obtained to rule eosinophilic  esophagitis which were negative.  Gallbladder ultrasound was unremarkable.  The liver was questioned to be nodular.  CT of the abdomen revealed no  evidence of liver abnormality.  A HIDA scan was low normal with an EF of 43%  but did not reproduce the symptoms.  On the day of discharge on exam the  patient is noted to have exquisite tenderness at the sternal rib junction  similar to TC  syndrome.  He received Toradol and had marked improvement in his pain.  He  still complained of some mild dysphagia but the patient was much improved.  He is being discharged to home on Nexium and Relafen with follow-up  for  possible monometry as per Dr. Karilyn Cota.  He is stable at the time of discharge  with the plan as noted above.      Hanley Hays Josefine Class, M.D.  Electronically Signed     FED/MEDQ  D:  05/16/2006  T:  05/16/2006  Job:  [id number]   cc:   Lionel December, M.D.  [address]  [place]  [place] [id number]

## 2011-04-21 NOTE — H&P (Signed)
NAME:  Trevor Newman, Trevor Newman                ACCOUNT NO.:  [id number]   MEDICAL RECORD NO.:  [id number]          PATIENT TYPE:  AMB   LOCATION:                                FACILITY:  [facility]   PHYSICIAN:  Trevor Newman, M.D.    DATE OF BIRTH:  [birth year]   DATE OF ADMISSION:  DATE OF DISCHARGE:  LH                                HISTORY & PHYSICAL   PRIMARY CARE PHYSICIAN:  Dr. Juanetta Newman.   HISTORY OF PRESENT ILLNESS:  [name] is a 55 year old, Caucasian male who  we have extensively evaluated in the past for atypical chest pain,  dysphagia, odynophagia.  He is here today with a new complaint.  He tells me  in the last couple weeks he has noticed several days of intermittent small  volume hematochezia.  He describes it as bright red blood all on toilet  tissue.  On one occasion he noticed a moderate amount of bright red blood  without any clots.  He states this is always after a bowel movement.  He  generally has daily bowel movements, can go up to 2 days without a bowel  movement, occasionally feels constipated.  He denies any hard stools, denies  any frequent straining or any known history of hemorrhoids.   As far as his chest pain is concerned, he was scheduled to have a manometry  but the tube was unable to be passed through his naris, therefore, the exam  had to be cancelled.  He still has intermittent chest pain.   PAST MEDICAL HISTORY:  Chronic GERD.  Last EGD May 14, 2006, by Dr. Jena Newman.  He was found to have a non-critical Schatzki's ring which was dilated with a  54-French balloon dilator and small hiatal hernia.  While hospitalized he  underwent an abdominal ultrasound on May 15, 2006, which showed irregular  transmission of sound and some nodularity without definite focal masses in  the liver, and a slightly prominent pancreatic duct and pancreatic head.  He  had a HIDA scan which showed a low-normal gallbladder ejection fraction of  43%.  CT of the abdomen revealed no evidence  of liver abnormality.  He has a  history of depression, appendectomy, rotator cuff repair and back injury  which resulted in disability.   CURRENT MEDICATIONS:  Nexium 40 mg daily.   ALLERGIES:  CODEINE.   FAMILY HISTORY:  There is no known family history of colon carcinoma, liver  or chronic GI problems.  Father deceased in his 29s secondary to lung  carcinoma who was a smoker.  Mother alive and healthy. He has 5 brothers,  all of whom are healthy.   SOCIAL HISTORY:  [name] is disabled.  He was previously a Naval architect.  He has a 33-pack year history of tobacco use, denies alcohol or drug use.  He has 1 healthy son who is 80 years old.   REVIEW OF SYSTEMS:  CONSTITUTIONAL:  His weight has been steadily  increasing.  Denies any fever or chills.  CARDIOVASCULAR:  See HPI.  PULMONARY:  Denies any shortness of breath,  dyspnea, cough or hemoptysis.   PHYSICAL EXAMINATION:  VITAL SIGNS:  Weight 169.5 pounds, height 68 inches.  Temperature 97.8, blood pressure 106/60, pulse 72.  GENERAL:  [name] is a 55 year old, Caucasian male who is alert and  oriented, pleasant and cooperative, in no acute distress.  HEENT:  Throat is clear.  Eyes, anicteric, conjunctivae pink.  Oropharynx  pink and moist without lesions.  NECK:  Supple without masses or thyromegaly.  CHEST:  Heart is regular rate and rhythm.  Normal S1 and S2.  Without  murmurs, clicks, rubs or gallops.  Lungs clear to auscultation bilaterally.  ABDOMEN:  Positive bowel sounds x4.  No bruits auscultated.  Soft,  nontender, nondistended.  Without palpable masses or hepatosplenomegaly.  No  rebound tenderness or guarding.  EXTREMITIES:  Without clubbing or edema bilaterally.  SKIN:  Pink, warm and dry without any rashes or jaundice.  RECTAL:  Deferred.   IMPRESSION:  [name] is a 55 year old, Caucasian male with recent small to  moderate volume intermittent hematochezia.  He is going to need colonoscopy  for further evaluation  to determine an etiology of his bleeding and rule out  colorectal carcinoma.  Will schedule this with Dr. Karilyn Newman soon.   He also continues to have intermittent chest pain, history of complicated  gastroesophageal reflux disease symptoms, well controlled on Nexium 40 mg  daily, except for bouts of intermittent chest pain and odynophagia.   PLAN:  1. Colonoscopy with Dr. Karilyn Newman in the near future.  I discussed the      procedure including risks and benefits to include but not limited to      bleeding, infection, perforation, or allergic reaction. He agrees with      the plan and consent was obtained.  2. Continue Nexium 40 mg daily.  3. Further recommendations pending colonoscopy.      Trevor Newman, N.P.      Trevor Newman, M.D.  Electronically Signed    KC/MEDQ  D:  09/11/2006  T:  09/11/2006  Job:  [id number]

## 2011-04-21 NOTE — Procedures (Signed)
[facility]  Patient:    Trevor Newman, Trevor Newman Visit Number: [id number] MRN: [id number]          Service Type: OUT Location: RAD Attending Physician:  Kathlen Brunswick Dictated by:   Vania Rea.Rinehuls, P.A. Proc. Date: 02/04/02 Admit Date:  02/04/2002   CC:         Kari Baars, M.D.  Lionel December, M.D.   Stress Test  DATE OF BIRTH:  11/18/1956  PROCEDURE:  Cardiolite stress test  CARDIOLOGIST:  Dietrich Pates, M.D.  INDICATIONS:  Mr. Vasek is a pleasant 55 year old male with a history of atypical chest pain, referred to Dr. Tenny Craw for further evaluation.  He has no documented coronary artery disease.  He does have a long history of tobacco use.  Prior to the study, the patient had no complaints of recent chest pain.  His baseline electrocardiogram showed sinus rhythm, rate 60 beats per minute, without ischemic changes.  The resting blood pressure is 110/80.  The target heart rate was 148.  DESCRIPTION OF PROCEDURE:  The patient was able to exercise for a total of nine minutes and 30 seconds, reaching a maximum heart rate of 160.  He was injected with the Cardiolite at seven minutes and 40 seconds into the study, at which time his heart rate was 146.  He experienced no chest pain during the study.  He had good exercise tolerance.  The electrocardiogram revealed flattening and slight T-wave inversion in lead III, but otherwise there were no significant ischemic changes.  The images are pending at the time of this dictation.  Dictated by:   Vania Rea.Rinehuls, P.A. Attending Physician:  Kathlen Brunswick DD:  02/04/02 TD:  02/04/02 Job: [id number]

## 2011-04-21 NOTE — Discharge Summary (Signed)
   NAME:  Trevor Newman, Trevor Newman NO.:  [id number]   MEDICAL RECORD NO.:  [id number]                   PATIENT TYPE:  INP   LOCATION:  A328                                 FACILITY:  [facility]   PHYSICIAN:  Vania Rea, M.D.              DATE OF BIRTH:  May 13, 1956   DATE OF ADMISSION:  10/03/2003  DATE OF DISCHARGE:  10/07/2003                                 DISCHARGE SUMMARY   DISCHARGE DIAGNOSES:  1. Gastritis.  2. Flank and right lower quadrant pain, resolved.  3. Depression.   DISPOSITION:  To home.   DISCHARGE CONDITION:  Stable.   DISCHARGE MEDICATIONS:  1. Lexapro 30 mg daily.  2. Seroquel 100 mg at bedtime.  3. Protonix 40 mg daily.   HOSPITAL COURSE:  Please refer to the history and physical of October 03, 2003.  The patient was admitted with right flank pain moving to the right  lower quadrant.  A urinalysis was negative for blood.  Abdominal ultrasound  was negative for renal or ureteric abnormality but was suggestive of a  possible pancreatic tumor.  A follow-up CT scan of the abdomen and pancreas  revealed no abnormality.  The patient's abdominal pain subsided, and then  the patient started to complain of difficulty swallowing and pain with  swallowing.  This led to a GI consult and an upper endoscopy, which revealed  mild changes of reflux in the lower esophagus but normal mucosa, nonerosive  gastritis of the antrum of the stomach, and multiple erosions of the bulb of  the duodenum.  The patient felt good after the upper endoscopy.  He  tolerated his evening meal, and he was discharged with a proton pump  inhibitor and to resume his outpatient medications.  To follow up with his  primary care physician and Dr. Karilyn Cota.     ___________________________________________                                         Vania Rea, M.D.   LC/MEDQ  D:  10/07/2003  T:  10/07/2003  Job:  [id number]

## 2011-04-21 NOTE — Procedures (Signed)
NAME:  SHIMSHON, NARULA                          ACCOUNT NO.:  [id number]   MEDICAL RECORD NO.:  [id number]                   PATIENT TYPE:  OUT   LOCATION:  RAD                                  FACILITY:  [facility]   PHYSICIAN:  Ethridge Bing, M.D.               DATE OF BIRTH:  12-02-1956   DATE OF PROCEDURE:  DATE OF DISCHARGE:                                  ECHOCARDIOGRAM   CLINICAL DATA:  A 55 year old gentleman with chest pain.   FINDINGS:  1. Technically-adequate echocardiographic study.  2. Normal left atrium, right atrium and right ventricle.  3. Normal aortic, tricuspid, and mitral valves; trivial tricuspid     regurgitation; normal estimated RV systolic pressure.  4. Normal internal dimension, wall thickness, regional global function of     the left ventricle.  5. Normal inferior vena cava.  6. Normal Doppler examination.      ___________________________________________                                            Mustang Bing, M.D.   RR/MEDQ  D:  05/09/2004  T:  05/09/2004  Job:  [id number]

## 2011-04-21 NOTE — Discharge Summary (Signed)
Trevor Newman, Trevor Newman NO.:  [id number]   MEDICAL RECORD NO.:  [id number]          PATIENT TYPE:  IPS   LOCATION:  0503                          [facility]ILITY[facility]  [facility]   PHYSICIAN:  Geoffery Lyons, M.D.      DATE OF BIRTH:  11-Aug-1956   DATE OF ADMISSION:  09/23/2004  DATE OF DISCHARGE:  09/30/2004                                 DISCHARGE SUMMARY   CHIEF COMPLAINT AND PRESENT ILLNESS:  This was one of several admissions to  [facility] for this 55 year old widowed white male.  Presented to [facility] complaining of suicidal thoughts.  Relapsed  on crack cocaine.  Was planning to kill himself.  Thought of several ways  including using his car.  He was evidencing no delusional ideas or  hallucinations.  Claimed he began using crack two days prior to this  admission.  Using, as he claimed, to cope with his depressed mood.  Has been  having suicidal thoughts for about a week and could not contract for safety  in the emergency room.   PAST PSYCHIATRIC HISTORY:  Last time admitted August 3rd to August 17th.  At  that time, had been using crack cocaine for the last two years.  Claimed  that he began using cocaine after his wife died.  He was admitted back in  August hearing voices.   MEDICAL HISTORY:  Back pain.   MEDICATIONS:  Symmetrel 100 mg twice a day, Wellbutrin XL 300 mg daily,  Risperdal 1 mg at night, Flexeril 5 mg three times a day as needed, Ambien  10 mg at bedtime for sleep, Vistaril 25 mg every six hours, trazodone 150 mg  at night and Ultram 50 mg, 1 every six hours as needed for pain.  Compliance  not clear.  These were his discharge medications from the last time he was  admitted.   PHYSICAL EXAMINATION:  Performed and failed to show any acute findings.   LABORATORY DATA:  Blood chemistry within normal limits.  TSH 1.895.   MENTAL STATUS EXAM:  Alert, cooperative male.  Good eye contact.  Speech was  normal rate, rhythm and tone.   Mood was depressed and anxious.  Affect was  congruent.  Thought processes were clear, rational and goal-oriented.  Endorsing no suicidal ideation.  Wanting to go to a long-term drug rehab.  Cognition was well-preserved.   ADMISSION DIAGNOSES:   AXIS I:  1.  Major depression, recurrent.  2.  Cocaine dependence.   AXIS II:  No diagnosis.   AXIS III:  Chronic back pain.   AXIS IV:  Moderate.   AXIS V:  Global Assessment of Functioning upon admission 25; highest Global  Assessment of Functioning in the last year 60.   HOSPITAL COURSE:  He was admitted and started in individual and group  psychotherapy.  He was placed on his medications.  Placed on Neurontin 300  mg three times a day, Risperdal 1.5 mg at 9 p.m., Cymbalta 30 mg per day and  Seroquel 150 mg.  Was given Vicoprofen for pain.  He  was started on Provigil  100 mg daily.  This was adjusted and he was placed on the Seroquel that was  increased up to 300 mg at bedtime.  Initially, he endorsed no energy, no  motivation, feeling empty and drained, giving up.  Endorsed that the lack of  energy was affecting him the most.  We did try this Provigil that seems to  be effective.  There was a family session with the mother.  She was  supportive.  We continued to work on developing a relapse prevention plan.  Was feeling unsafe to be discharged home.  We continued to work with the  Provigil and went up to 200 mg in the morning.  Difficulty with sleep.  Endorsed occasional voices, so we continued to work with the Seroquel.  On  October 28th, he was in full contact with reality.  There were no suicidal  ideation, no homicidal ideation, no hallucinations, no delusions.  Endorsed  he was feeling much better.  No suicidal ideation as already stated.  More  energy.  Decreased markedly on the voices.  He was given Ambien to help him  sleep.  Was wanting to pursue medication further.  Had continued to work on  a relapse prevention plan.    DISCHARGE DIAGNOSES:   AXIS I:  1.  Major depression with psychotic features.  2.  Cocaine dependence.   AXIS II:  No diagnosis.   AXIS III:  Chronic back pain.   AXIS IV:  Moderate.   AXIS V:  Global Assessment of Functioning upon discharge 50-55.   DISCHARGE MEDICATIONS:  1.  Wellbutrin XL 300 mg in the morning.  2.  Neurontin 300 mg twice a day and at bedtime.  3.  Risperdal 1 mg, 1-1/2 at night.  4.  Cymbalta 30 mg per day.  5.  Provigil 200 mg, 1 in the morning and 1/2 at 1 p.m.  6.  Seroquel 300 mg at bedtime.  7.  Protonix 40 mg daily.  8.  Ambien 10 mg at bedtime for sleep.  9.  Flexeril 10 mg, 1/2 three times a day as needed.  10. Ultram 50 mg, 1 every six hours as needed for pain.   FOLLOW UP:  [facility].     Farrel Gordon   IL/MEDQ  D:  10/25/2004  T:  10/25/2004  Job:  [id number]

## 2011-04-21 NOTE — H&P (Signed)
Trevor Newman, BISTLINE NO.:  [id number]   MEDICAL RECORD NO.:  [id number]          PATIENT TYPE:  IPS   LOCATION:  0503                          [facility]ACILITY[facility]  [facility]   PHYSICIAN:  Geoffery Lyons, M.D.      DATE OF BIRTH:  1956/06/19   DATE OF ADMISSION:  09/23/2004  DATE OF DISCHARGE:                         PSYCHIATRIC ADMISSION ASSESSMENT   IDENTIFYING INFORMATION:  This is a 55 year old widowed white male.  Apparently he presented to [facility] complaining of suicidal  thoughts and relapsed on crack cocaine.  He stated he was planning to kill  himself.  He had thought of several ways including using his car.  He was  not homicidal.  He was neither delusional or experiencing hallucinations.  He was alert and oriented x3.  He claims that he began using crack 2 days  prior to admission.  He was using this to cope with his depressed mood.  He  has been having suicidal thoughts for about a week and could not contract  for safety in the emergency room and hence he was admitted.  He was last  with [place] August 3 to August 17.  At that time he again was having suicidal  thoughts.  At that time he claimed that he had been using crack cocaine for  the past 2 years, with the last use a couple of days prior to his admission.  He stated that he began using cocaine after his wife died and at the time he  was admitted back in August he was hearing voices telling him to hurt  himself but he was wanting to go to rehab.  He also apparently had an  admission to [facility] and this was due to overdosing on Xanax  and other medications and that was probably in February of 2005.   SOCIAL HISTORY:  He has obtained a GED.  He was given a depression  disability after his wife died 2 years ago from cancer.  Apparently they  were married, then they were divorced.  They each remarried, then they met  and remarried.  Two weeks after remarrying she was diagnosed with lung   cancer.   FAMILY HISTORY:  He denies anyone else in the family having any issues.   ALCOHOL AND DRUG ABUSE:  He does smoke 1 pack a day, has for many years, and  he relapsed on crack, he states for the first time, 2 days ago.   PAST MEDICAL HISTORY:  Primary care Keisi Eckford:  He goes to the health  department.  Medical problems:  He has none other than back pain at times.  He is not prescribed any medications other than his psychiatric medications.  At the time of discharge back in August, he was given Symmetrel 100 mg twice  a day, Wellbutrin XL 300 mg p.o. q.a.m., Risperdal 1 mg at h.s., Flexeril 5  mg t.i.d. p.r.n., Ambien 10 mg at h.s. p.r.n., Vistaril 25 mg q.6h p.r.n.  anxiety, trazodone 150 mg at h.s. and Ultram 50 mg 1 every 6 hours as needed  for pain.  An order was written last night upon admission to call and check  at the pharmacy, however no one did that.  So we are not sure if he ever  took these after his last discharge.   ALLERGIES:  He claims he is allergic to CODEINE.  It feels like bugs are  crawling on him.   POSITIVE PHYSICAL FINDINGS:  PHYSICAL EXAMINATION:  He was cleared at [facility].  The physical examination is on the chart.  It is not repeated at this  time.  His urine drug screen was positive for cocaine.  His alcohol was less  than 5.   MENTAL STATUS EXAM:  He is alert and oriented x3.  His appearance is casual  grooming.  His behavior is cooperative.  His gait is normal.  His eye  contact is good.  His motor is normoactive.  His speech is a normal rate,  rhythm and tone.  His mood is slightly depressed and anxious.  His affect is  congruent.  His thought processes are clear, rational and goal oriented.  He  is asking for long-term drug rehab which is incidentally what he asked for  when he was here last time.  Concentration and memory are intact.  Judgment  and insight are fair.  Intelligence is at least average.  Currently he  denies suicidal or  homicidal ideation and he denies any auditory or visual  hallucinations.   ADMISSION DIAGNOSES:   AXIS I:  1.  Major depressive disorder with no psychotic features at this time.  2.  He has relapsed on crack cocaine.  He has probably cocaine dependence.   AXIS II:  Deferred.   AXIS III:  None known.  Reports chronic back pain.   AXIS IV:  Severe. Grief.   AXIS V:  Global assessment of function is 25.Marland Kitchen   PLAN:  Plan is to admit for stabilization, to help readjust his medications  and make sure he is compliant with his medications and to help identify long-  term drug rehab program for him.     Mick   MD/MEDQ  D:  09/23/2004  T:  09/23/2004  Job:  [id number]

## 2011-04-21 NOTE — Op Note (Signed)
NAME:  Trevor Newman, Trevor Newman NO.:  [id number]   MEDICAL RECORD NO.:  [id number]          PATIENT TYPE:  INP   LOCATION:  A218                          FACILITY:  [facility]   PHYSICIAN:  R. Roetta Sessions, M.D. DATE OF BIRTH:  [birth year]   DATE OF PROCEDURE:  05/14/2006  DATE OF DISCHARGE:                                 OPERATIVE REPORT   PROCEDURE PERFORMED:  EGD with balloon dilation followed by esophageal  mucosal biopsy.   ENDOSCOPIST:  Jonathon Bellows, MD.   INDICATIONS FOR PROCEDURE:  The patient is a 55 year old gentleman with  basically a one-week history of post prandial lower retrosternal chest  discomfort.  He has had a workup over at [facility] just last week  with no significant findings, with a history of GERD, and a history of  __________reflux esophagitis with dysphagia requiring empiric dilation  previously.  It is notable he has a peripherally elevated eosinophilic  count.  His last gallbladder ultrasound was in 2004, and there were no  significant findings at that time.  EGD is now being done after discussion  with the patient.  The potential risks, benefits, and alternatives have been  reviewed, questions answered, he is agreeable to proceed.  Documentation in  the medical record.   PROCEDURE NOTE:  Oxygen saturation, blood pressure, pulse oximetry, the  patient monitored at the time of procedure, conscious sedation with IV  Versed and Demerol in incremental doses, Cetacaine spray for topical  oropharyngeal anesthesia.   INSTRUMENT USED:  Olympus video chip system.   FINDINGS:  Examination of the tubular esophagus revealed a noncritical  Schatzki's ring.  The esophageal mucosa almost appeared normal.  There was  no feline or trachealized appearance seen.  The EG-junction was easily  traversed with the scope into the stomach and colon.  The gastric cavity was  empty and insufflated well with air.  Thorough inspection of the gastric  mucosa on  retroflexion into the proximal stomach and gastroesophageal  junction demonstrated a small hiatal hernia.  The pylorus was patent and  easily traversed.  Examination of the bulb and second portion revealed no  abnormalities.   THERAPEUTIC/DIAGNOSTIC MANEUVERS PERFORMED:  A 54-French balloon dilator was  passed to full insertion.  A look back revealed that there were no  complications with passing of the dilator.  Subsequently, biopsies of the  esophageal mucosa were taken to rule out eosinophilic esophagitis.  The  patient tolerated the procedure well and was reacted in Endoscopy.   IMPRESSION:  1.  Noncritical Schatzki's ring; otherwise, endoscopically normal-appearing      esophagus status post balloon dilation followed by esophageal mucosal      biopsy.  2.  Small hiatal hernia; otherwise normal stomach, D1-D2.   ASSESSMENT:  Today's findings do not explain the patient's post prandial  chest pain.  I am suspicious for occult gallbladder disease.  As stated  above, he had a negative gallbladder ultrasound in 2004.   RECOMMENDATIONS:  1.  We will proceed a gallbladder ultrasound today.  2.  Follow up on path.  3.  Further recommendations to follow.      Jonathon Bellows, M.D.  Electronically Signed     RMR/MEDQ  D:  05/14/2006  T:  05/14/2006  Job:  [id number]

## 2011-04-21 NOTE — Op Note (Signed)
NAME:  Trevor Newman, Trevor Newman NO.:  [id number]   MEDICAL RECORD NO.:  [id number]          PATIENT TYPE:  AMB   LOCATION:  DAY                           FACILITY:  [facility]   PHYSICIAN:  Lionel December, M.D.    DATE OF BIRTH:  1956-09-03   DATE OF PROCEDURE:  09/17/2006  DATE OF DISCHARGE:                                 OPERATIVE REPORT   PROCEDURE:  Colonoscopy with polypectomy.   INDICATIONS:  Trevor Newman is a 55 year old Caucasian male, who is experiencing  an episode of hematochezia recently which he describes passing large amount  of blood with bowel movement.  Family history is negative for colorectal  carcinoma.  Procedure risks were reviewed with the patient, and informed  consent was obtained.   MEDICATIONS FOR CONSCIOUS SEDATION:  1. Demerol 25 mg IV.  2. Versed 6 mg IV in divided dose.   FINDINGS:  Procedure performed in endoscopy suite.  The patient's vital  signs and O2 saturation were monitored during the procedure and remained  stable.  The patient was placed in left lateral decubitus position and  rectal examination performed.  No abnormality noted on external or digital  exam.  Olympus video scope was placed in rectum and advanced into sigmoid  colon beyond.  Preparation was excellent.  Scope was passed into cecum which  was identified by appendiceal orifice/stump and ileocecal valve.  Pictures  taken for the record.  Short segment of TI was also examined and was normal.  Colonic mucosa was examined for the second time on the way out.  There was a  5 mm sessile polyp at midsigmoid colon which was snared.  A tiny part of the  polyp was left to the proximal margin which was coagulated using snare tip.  This polyp was retrieved for histologic examination.  Mucosa rest of the  sigmoid colon and rectum was normal.  Scope was retroflexed to examine  anorectal junction, and hemorrhoids were noted above the dentate line.  Endoscope was straightened and withdrawn.  The  patient tolerated the  procedure well.   FINAL DIAGNOSES:  1. A 5 mm polyp snared from sigmoid colon.  2. Internal hemorrhoids.  Possible source of recent rectal bleed.  3. Normal terminal ileum.   RECOMMENDATIONS:  1. High-fiber diet.  2. I will be contacting the patient with results of biopsy and further      recommendations.      Lionel December, M.D.  Electronically Signed     NR/MEDQ  D:  09/17/2006  T:  09/17/2006  Job:  [id number]   cc:   Ramon Dredge L. Juanetta Gosling, M.D.  Fax: [phone]

## 2011-04-21 NOTE — Procedures (Signed)
NAME:  MERCURY, ROCK NO.:  [id number]   MEDICAL RECORD NO.:  [id number]                   PATIENT TYPE:  OUT   LOCATION:  RAD                                  FACILITY:  [facility]   PHYSICIAN:  Morrison Bing, M.D.               DATE OF BIRTH:  02/22/1956   DATE OF PROCEDURE:  DATE OF DISCHARGE:                               STRESS-ECHOCARDIOGRAM   REFERRING PHYSICIAN:  Edward L. Juanetta Gosling, M.D.   CLINICAL DATA:  A 55 year old gentleman presenting with chest pain and a  history of tobacco abuse.   FINDINGS:  1. Upright treadmill exercise performed to a workload of 13 mets and a heart     rate of 163, 94% of age-predicted maximum.  Exercise discontinued due to     fatigue; chest pain was present prior to exercise, and increased with     further exertion.  Mild dyspnea was also reported.  2. Blood pressure increased from a resting value of 110/80 to 160/90 at peak     exercise, a normal response.  3. No arrhythmias.   RESTING EKG:  Sinus bradycardia; right axis deviation; slight J point  elevation consistent with early repolarization.   STRESS EKG:  Insignificant upsloping ST-segment depression.   BASELINE ECHOCARDIOGRAPHIC IMAGING:  Normal chamber dimensions, normal  aortic and mitral valves; normal regional and global LV systolic function.   POST-STRESS ECHOCARDIOGRAPHIC IMAGES:  Markedly-increase contractility in  all myocardial segments.   IMPRESSION:  1. Negative stress echocardiogram revealing excellent exercise tolerance.  2. A negative stress EKG, and no echocardiographic evidence for myocardial     ischemia or infarction.  3. Other findings as noted.      ___________________________________________                                            Sardis Bing, M.D.   RR/MEDQ  D:  05/09/2004  T:  05/09/2004  Job:  [id number]

## 2011-04-21 NOTE — H&P (Signed)
Trevor Newman, SKELLEY NO.:  [id number]   MEDICAL RECORD NO.:  [id number]          PATIENT TYPE:  EMS   LOCATION:  ED                            FACILITY:  [facility]   PHYSICIAN:  Osvaldo Shipper, MD     DATE OF BIRTH:  06-Jul-1956   DATE OF ADMISSION:  05/13/2006  DATE OF DISCHARGE:  LH                                HISTORY & PHYSICAL   The patient does not have a primary care doctor.   ADMISSION DIAGNOSES:  1.  Chest pain with dysphagia.  2.  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  3.  History of atherosclerotic disease.   CHIEF COMPLAINT:  Chest pain since Monday.   HISTORY OF PRESENT ILLNESS:  The patient is a 55 year old Caucasian male who  had onset of chest pain back on Monday that is about seven days ago.  The  pain was located in the retrosternal to left side of the chest.  Described  as sharp in nature with no radiation.  He had some mild shortness of breath.  No diaphoresis, some palpitation and he felt a little bit dizzy but did not  have any significant episodes.  The pain is mostly present when he tries to  swallow foods.  He also has some difficulty with swallowing his foods, solid  more than liquid.  He presented with these symptoms to [facility] and was admitted there from June 7 through May 12, 2006, ruled out  for acute coronary syndrome.  He underwent a barium esophagogram which was  unremarkable.  He felt he was having relief of pain with nitroglycerin.  He  was prescribed sublingual nitroglycerin as well as Imdur.   The patient mentioned that he has not had any relief of pain with the  currently prescribed medications.  He did take two of his sublingual  nitroglycerin today with which he had some relief of pain but not  significantly.  Currently, he is having 8/10 chest pain.   MEDICATIONS AT HOME:  Nexium, nitroglycerin, Imdur.   ALLERGIES:  CODEINE.   PAST MEDICAL HISTORY:  1.  Acid reflux disease.  2.  History of depression in  the past, none currently.  3.  No history of hypertension, heart disease or lung disease that he knows.  4.  Based on the E-chart, the patient apparently had a stress test in March      of 2003 which possibly was unremarkable.  Then he had a stress      echocardiogram in June of 2005 which again was unremarkable.   He had an EGD in November of 2004 during which he underwent esophageal  dilatation.  It does not look like he has had any more EGDs or esophageal  dilatations.   PAST SURGICAL HISTORY:  1.  Appendectomy.  2.  Ingrown toe nail.  3.  Cyst on the buttocks.  4.  He has had rotator cuff injury of his left shoulder as well.   SOCIAL HISTORY:  Lives in [place] with his finance.  Smokes one pack of  cigarettes per day.  No alcohol  use.  No illicit drug use currently.  He  used to smoke marijuana and cocaine more than a year ago.  He said that when  he was depressed he did drugs.  Depression was mostly related to his wife's  death.   FAMILY HISTORY:  Father died of metastatic esophageal cancer at age 56.  The  patient told me he had lung cancer which I am not able to corroborate.  Based on previous medical history, he had father had esophageal cancer.  There is also a history of heart disease on his father's side.  Mother is  alive and well.  His siblings do not have any significant medical problems.   REVIEW OF SYSTEMS:  Unremarkable except as mentioned in the HPI.   PHYSICAL EXAMINATION:  VITAL SIGNS:  Temperature is 97.6, blood pressure  129/72, heart rate is 77, respiratory rate is 22, saturation 97% on room  air.  GENERAL:  This is a well-developed, well-nourished individual in no acute  distress.  HEENT:  There is no pallor and no icterus.  Oral mucus membrane is moist.  No old lesions appreciated.  LUNGS:  Clear to auscultation bilaterally.  No wheezes, rales or rhonchi  appreciated.  CARDIOVASCULAR:  S1 and S2 is normal and regular.  No murmurs appreciated.   ABDOMEN:  Soft, nontender, and nondistended.  Bowel sounds are present.  No  mass or organomegaly appreciated.  EXTREMITIES:  Without edema.  Peripheral pulses are palpable.   LABORATORY DATA:  CBC shows a white count of 10.8 with 11% eosinophils,  neutrophil count is 1200 which is high.  His hemoglobin is 15.6, platelet  count is 368,000.  D-dimer is 0.22, BNP is unremarkable.  Initial set of  cardiac markers unremarkable.  He had a chest x-ray which did not show any  acute abnormalities.  EKG showed sinus rhythm with normal axis intervals,  appeared to be within the normal range.  No Q-waves are identified.  This  possible early repolarization seen in V1 and V2.  There are some prominent T-  waves noted in V2 and V3.  Otherwise no other concerning changes are noted  at this time.   IMPRESSION:  This is a 55 year old Caucasian male with history of acid  reflux disease in the past, history of dysphagia in the past for which he  has required esophageal dilatation.  Presents with chest pain since Monday  and this is the second admission to the hospital in the past one week.  The  most likely etiology for the symptoms is possibly related to esophageal  disease, possibly eosinophillic esophagitis.  Cardiac etiology is less  likely considering symptomatology as well as previous two negative stress  tests done within the last two years.  Exam also negative.  Musculoskeletal  and acid reflux disease are also considerations.   PLAN:  1.  Chest pain:  We will admit the patient to telemetry.  We will do serial      cardiac enzymes to rule him out once more.  GI will be consulted in the      morning to consider upper endoscopy in this patient and dilatation if      need be.  An echocardiogram will be ordered on this patient to see if      there is any significant changes from echocardiogram done two years ago.     PPI will be initiated.  He will be given a trial of nitroglycerin again.  If       no relief is obtained, we will consider other analgesic agents.  EKG      will be repeated in the morning.  Urine drug screen will be done.  BNP      will be checked.  2.  DVT prophylaxis will provided.      Osvaldo Shipper, MD  Electronically Signed     GK/MEDQ  D:  05/14/2006  T:  05/14/2006  Job:  [id number]   cc:   Lionel December, M.D.  [address]  [place]  [place] [address]

## 2011-04-21 NOTE — Discharge Summary (Signed)
Trevor Newman NO.:  [id number]   MEDICAL RECORD NO.:  [id number]          PATIENT TYPE:  IPS   LOCATION:  0502                          FACILITY:  [facility]   PHYSICIAN:  Jeanice Lim, M.D. DATE OF BIRTH:  06/01/1956   DATE OF ADMISSION:  12/09/2004  DATE OF DISCHARGE:  12/19/2004                                 DISCHARGE SUMMARY   IDENTIFYING DATA:  This is a 55 year old widowed Caucasian male voluntarily  admitted.  Reported wife died of cancer three years ago.  Holidays are  rough.  Worst time of year.  Not sleeping.  Currently living with 75-year-  old mother.  Was here 2-3 months ago.  States this is his third or fourth  admission to [facility].  Had been at [facility] previously and was followed as  an outpatient at [facility].  He used to be a  Engineer, drilling.  Receives social security disability for depression,  which began after the death of his wife.  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  Followed up by [facility] regarding  primary care.   MEDICATIONS:  Current prescribed medications of Wellbutrin XL 300 mg q.a.m.,  Seroquel 300 mg q.h.s. and Ambien 10 mg q.h.s.   ALLERGIES:  CODEINE.   PHYSICAL EXAMINATION:  Physical and neurologic exam essentially within  normal limits.   LABORATORY DATA:  Routine admission labs essentially within normal limits.   MENTAL STATUS EXAM:  The patient was alert and oriented.  No psychomotor  abnormalities except for slightly decreased rate of speech.  Mood was  depressed.  Affect flat.  Thought processes goal directed.  Judgment and  insight poor.  Cognition grossly intact.  Was denying acute suicidal  ideation.  Reported not wanting to do anything.  Primarily complaining of  apathy.  No motivation.  Still hears mumbling and occasionally flashes go by  his eyes.  Unable to described further detail.   ADMISSION DIAGNOSES:   AXIS I:  1.  Major  depressive disorder, recurrent with psychotic features,      specifically reporting auditory and visual hallucinations, questionable      regarding pseudo hallucinations or medication effect.  2.  The patient denies having used cocaine for at least two months but      history of cocaine abuse as well as polysubstance abuse and urine drug      screen was in fact negative for cannabis, benzodiazepines, cocaine,      opiates.  Alcohol level less than 5.  So, no evidence of recent      substance abuse.  TSH 4.385.   AXIS II:  Deferred.   AXIS III:  History of noncardiac chest pain.   AXIS IV:  Moderate to severe (grief over loss of wife and limited support  system, disabled).   AXIS V:  35/55-60.   HOSPITAL COURSE:  The patient was admitted and ordered routine p.r.n.  medications and underwent further monitoring.  Was encouraged to participate  in individual, group and milieu therapy.  The patient  was resumed Newman medical  and psychotropic medications.  Monitored for safety. Abilify was started at  the time of admission as well as Carafate and then Protonix adjusted.  The  patient's Abilify was decreased.  The patient denied acute psychotic  symptoms Newman the day following admission.  The patient was agreeable for  residential substance abuse treatment.  The patient later admitted to having  used but only one time and several days prior to admission.  Seroquel was  adjusted along with trazodone.  Provigil added for daytime fatigue and  sedation and Strattera for also attention and concentration and history of  ADD.  Sleep medications were optimized and patient reported a positive  response to clinical interventions and was discharged with a three-day  supply of medications, reporting significant improvement.   CONDITION Newman DISCHARGE:  Mood was euthymic.  Affect brighter.  Thought  processes goal directed.  Thought content negative for dangerous ideation or  psychotic symptoms.  The patient  reported a positive response to medication  and risk/benefit ratio and alternative treatments and medication education  given.   DISCHARGE MEDICATIONS:  1.  Wellbutrin XL 300 mg q.a.m.  2.  Colace 100 mg q.h.s.  3.  Carafate 1 g/ml, 1 b.i.d.  4.  Trazodone 150 mg at 9 p.m.  5.  Provigil 200 mg q.a.m.  6.  Seroquel 300 mg at 9 p.m.  7.  Flexeril 10 mg, 1/2 t.i.d.  8.  Neurontin 100 mg b.i.d. and 2 at 9 p.m.  9.  Protonix 40 mg q.a.m.  10. Abilify 10 mg q.a.m.  11. Strattera 25 mg q.a.m.   FOLLOW UP:  The patient was discharged to follow up with intensive  outpatient treatment at [facility] [place] [place] residential  substance abuse treatment, possibly 14-day, and then [facility] for further follow-up.  Seek all substance abuse  treatment resources available to him in the community as well as medication  management regarding psychiatric medications and medical follow-up.   DISCHARGE DIAGNOSES:   AXIS I:  1.  Major depressive disorder, recurrent with psychotic features,      specifically reporting auditory and visual hallucinations, questionable      regarding pseudo hallucinations or medication effect.  2.  The patient denies having used cocaine for at least two months but      history of cocaine abuse as well as polysubstance abuse and urine drug      screen was in fact negative for cannabis, benzodiazepines, cocaine,      opiates.  Alcohol level less than 5.  So, no evidence of recent      substance abuse.  TSH 4.385.   AXIS II:  Deferred.   AXIS III:  History of noncardiac chest pain.   AXIS IV:  Moderate to severe (grief over loss of wife and limited support  system, disabled).   AXIS V:  Global Assessment of Functioning Newman discharge 60.      JEM/MEDQ  D:  01/15/2005  T:  01/16/2005  Job:  [id number]

## 2011-04-21 NOTE — Discharge Summary (Signed)
NAME:  Trevor Newman, Trevor Newman NO.:  [id number]   MEDICAL RECORD NO.:  [id number]          PATIENT TYPE:  IPS   LOCATION:  0501                          [facility]ILITY[facility]  [facility]   PHYSICIAN:  Anselm Jungling, MD  DATE OF BIRTH:  May 12, 1956   DATE OF ADMISSION:  06/23/2005  DATE OF DISCHARGE:  06/28/2005                                 DISCHARGE SUMMARY   IDENTIFYING DATA AND REASON FOR ADMISSION:  [facility] [place], a 55 year old widowed white male who  presented himself for treatment due to increasing depression, decreasing  sleep, and suicidal ideation without plan.  He had been grieving the loss of  his wife who died 3 years prior.  He also reported auditory hallucinations  consisting of mumbling, and visual hallucinations consisting of flashes.  He  had last been admitted to [facility] during January 2006, and prior to this, had 2  other admissions in August 2005 and October 2005.  The patient had been on a  regimen of Wellbutrin, Seroquel, Abilify, trazodone, Provigil, and  Strattera, but apparently he had let his prescriptions lapse and had not  taken them for at least 2 months.   INITIAL DIAGNOSTIC IMPRESSION:  AXIS I:  Major depressive disorder,  recurrent, severe, with psychotic features.  AXIS II:  Deferred.  AXIS III:  Intolerant of Seroquel and codeine, history of asthma,  gastroesophageal reflux disease, and decreased nutrition.  AXIS IV:  Stressors, severe.  AXIS V:  Global assessment of functioning 30.   MEDICAL AND LABORATORY:  The patient was assessed medically and physically.  A CBC was essentially within normal limits.  A chemistry panel was also  within normal limits with the exception of slightly reduced protein at 7.5  and slightly reduced albumin at 3.1, consistent with poor nutritional  intake.   HOSPITAL COURSE:  The patient was admitted to the adult inpatient service  where he participated in various  therapeutic groups, activities and classes  designed to help him acquire better coping skills, a better understanding of  his underlying disorders and dynamics, and the development of an aftercare  plan.  He was a pleasant gentleman and a good participant in the milieu  program.   On the second hospital day, Abilify was restarted at 20 mg daily, Provigil  at 200 mg daily, and for his medical conditions, Protonix 40 mg daily, and  Flovent inhaler.  The following day, Wellbutrin XL 150 mg daily was  reinstated.  The patient had mild symptoms of upper respiratory infection  with chest congestion, and he was given guaifenesin for this.   Over the remaining 3 days of his inpatient stay, he continued to improve  with respect to mood, sleeping and eating.  He was absent any further  suicidal ideation, auditory or visual hallucinations.   AFTERCARE PLANS:  The patient was to be seen in followup at [facility] on August 1, at 3:30 p.m., with Yvonna Alanis.   DISCHARGE MEDICATIONS:  Abilify 20 mg p.o. daily, Wellbutrin XL 150 mg p.o.  q.a.m., trazodone 200 mg q.h.s., and Protonix 40 mg q.a.m.   DISCHARGE DIAGNOSES:  AXIS I:  Major depressive disorder, recurrent, with  psychotic features, resolving.  AXIS II:  Deferred.  AXIS III:  Intolerant to Seroquel and codeine, history of asthma,  gastroesophageal reflux disease, and decreased nutritional status.  AXIS IV:  Stressors, severe.  AXIS V:  Global assessment of function on discharge 55.           ______________________________  Anselm Jungling, MD  Electronically Signed     SPB/MEDQ  D:  07/17/2005  T:  07/17/2005  Job:  [id number]

## 2011-04-21 NOTE — Op Note (Signed)
Trevor Newman, BALLOW NO.:  [id number]   MEDICAL RECORD NO.:  [id number]          PATIENT TYPE:  OBV   LOCATION:  A339                          FACILITY:  [facility]   PHYSICIAN:  Dalia Heading, M.D.  DATE OF BIRTH:  Jul 27, 1956   DATE OF PROCEDURE:  12/31/2006  DATE OF DISCHARGE:                               OPERATIVE REPORT   PREOPERATIVE DIAGNOSIS:  Malrotation of small intestine.   POSTOPERATIVE DIAGNOSIS:  Malrotation of small intestine.   PROCEDURE:  Duodenojejunostomy,   SURGEON:  Dr. Franky Macho.   ANESTHESIA:  General endotracheal.   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  He has subsequently undergone a CT scan and  upper GI series which shows a partial malrotation of the small  intestine.  He appears to have a partial obstruction between the third  and fourth portions of the duodenum.  This is causing hypertrophy of the  proximal duodenal wall.  He is status post appendectomy in the past.  Cecum is in the right lower quadrant.  The patient comes to the  operating room for exploratory laparotomy.  Risks and benefits of the  procedure including bleeding, infection, and the possibility of  recurrence of the symptoms were fully explained to the patient, who gave  informed consent.   PROCEDURE NOTE:  The patient was placed in supine position.  After  induction of general endotracheal anesthesia, the abdomen was prepped  and draped using the usual sterile technique with Betadine.  Surgical  site confirmation was performed.   A midline incision was made from above the umbilicus to the umbilical  level.  The peritoneal cavity was entered into without difficulty.  The  patient was status post laparoscopic cholecystectomy as well as  appendectomy in the remote past.  The colon was within normal limits.  The small bowel was  inspected and the patient's anatomy was abnormal.  It appeared that he had both a superior mesenteric artery branch as well  as another major vascular pedicle coming from the right side of the  posterior peritoneum.  This formed an arch through which the duodenum  passed.  There was a partial kinking up between the third and fourth  portions of the duodenum.  As it could not be identified which  mesenteric pedicle was the primary pedicle, it was elected to proceed  with a duodenojejunostomy.  GIA stapler was placed across the fourth  portion of the duodenum after its exit from the ligament of Treitz.  A  side-to-side duodenojejunostomy was performed in a two-layer fashion  using a 3-0 chromic gut running suture as well as 3-0 silk Lambert  sutures.  A #10 flat Jackson-Pratt drain was placed into this region and  secured at the skin level using a 3-0 nylon interrupted suture.  The  bowel was returned to the abdominal cavity in orderly fashion.  Fascia  was reapproximated using a 0 PDS running suture.  Subcutaneous layer was  irrigated normal saline.  The skin was closed using staples.  Betadine  ointment dry sterile dressing were applied.   All tape and needle counts correct at the end of procedure.  The  nasogastric tube could not be placed by anesthesia.  The patient was  extubated in the operating room and went back to recovery room awake in  stable condition.   COMPLICATIONS:  None.   SPECIMEN:  None.   BLOOD LOSS:  Minimal.      Dalia Heading, M.D.  Electronically Signed     MAJ/MEDQ  D:  12/31/2006  T:  12/31/2006  Job:  [id number]   cc:   Ramon Dredge L. Juanetta Gosling, M.D.  Fax: [phone]   Lionel December, M.D.  [address]  [place]  [place] [address]

## 2011-04-21 NOTE — Group Therapy Note (Signed)
NAMEXAVI, TOMASIK NO.:  [id number]   MEDICAL RECORD NO.:  [id number]          PATIENT TYPE:  INP   LOCATION:  A339                          FACILITY:  [facility]   PHYSICIAN:  Edward L. Juanetta Gosling, M.D.DATE OF BIRTH:  [birth year]   DATE OF PROCEDURE:  DATE OF DISCHARGE:  01/03/2007                                 PROGRESS NOTE   Mr. Glidden was doing better and being set up for discharge home.  He has  no new complaints.  I will plan to follow him once he gets home.      Edward L. Juanetta Gosling, M.D.  Electronically Signed     ELH/MEDQ  D:  01/03/2007  T:  01/03/2007  Job:  [id number]

## 2011-04-21 NOTE — Discharge Summary (Signed)
NAME:  Trevor Newman, Trevor Newman NO.:  [id number]   MEDICAL RECORD NO.:  [id number]                   PATIENT TYPE:  IPS   LOCATION:  0505                                 FACILITY:  [facility]   PHYSICIAN:  Geoffery Lyons, M.D.                   DATE OF BIRTH:  January 11, 1956   DATE OF ADMISSION:  07/06/2004  DATE OF DISCHARGE:  07/20/2004                                 DISCHARGE SUMMARY   CHIEF COMPLAINT AND PRESENT ILLNESS:  This was the first admission to [facility] for this 55 year old widowed white male voluntarily  admitted.  History of suicidal thoughts for at least a week with no specific  plan.  Has been using crack cocaine for the past two years with last use on  Wednesday prior to this admission.  He started using cocaine after his wife  died.  She had passed away about two years prior to this admission.  He has  been binging for four days with no sleep and decreased appetite and five-  pound weight loss.  Hearing voices telling him to hurt himself but he feels  safe at the time of this evaluation.  He was wanting to go to rehab to get  himself feeling better.   PAST PSYCHIATRIC HISTORY:  First time at [facility].  Sees Dr.  Thomasena Edis at mental health.  Was in [facility] six months ago for suicidal  thoughts and overdose on Xanax.   ALCOHOL/DRUG HISTORY:  As already stated, used crack cocaine for two years  on a daily basis.  Denies any IV drug use or any current alcohol use.   MEDICAL HISTORY:  Chest pain workup in the past where he was put on a stress  test but was told everything was fine.   MEDICATIONS:  Wellbutrin XL 150 mg daily, Seroquel 100 mg at night.  Has  been on Lexapro in the past.   PHYSICAL EXAMINATION:  Performed and failed to show any acute findings.   LABORATORY DATA:  Urine drug screen was positive for marijuana and cocaine.  CBC with white blood cells 11.1, potassium 3.3.  Liver profile within normal  limits.   TSH 1.258.   MENTAL STATUS EXAM:  Alert, cooperative male with fair eye contact.  Speech  was clear.  Normal rate, tempo and production.  Mood was depressed.  Affect  was constricted.  Appears sad over his substance use.  Concerned about his  mother who has been through quite a bit with him.  Thought processes are  logical, coherent and relevant.  Denies any active suicidal or homicidal  ideation.  There is no evidence of delusions, no hallucinations.  Cognition  well-preserved.   ADMISSION DIAGNOSES:   AXIS I:  1.  Depressive disorder not otherwise specified.  2.  Cocaine and marijuana abuse; rule out dependence.   AXIS II:  No diagnosis.  AXIS III:  No diagnosis.   AXIS IV:  Moderate.   AXIS V:  Global Assessment of Functioning upon admission 30; highest Global  Assessment of Functioning in the last year 60-65.   HOSPITAL COURSE:  He was admitted and started individual and group  psychotherapy.  He was given Ambien.  He was placed on Librium.  He was  given Seroquel and placed back on Wellbutrin.  He was also given Symmetrel  100 mg twice a day and Wellbutrin was increased to Wellbutrin XL 300 mg in  the morning.  Seroquel was discontinued as he felt it was not effective and  he was given Zyprexa 2.5 mg that was increased to 5 mg at night.  Eventually, Zyprexa was discontinued and he was placed on Risperdal.  After  he was admitted, endorsed voices, not command in nature, and endorsed  depression.  Initially endorsed vague suicidal ideation.  Continued to feel  depressed, difficult time abstaining.  Afraid to be left by himself.  Continued to endorse the depression, worried, concerned, afraid he will go  back to the same situation, afraid of relapsing, very dysphoric.  Wellbutrin  was increased to 300 mg and he was placed on trazodone 150 mg for sleep.  He  started endorsing back pain that he said he gets every now and then.  Felt  that it was the sitting and sleeping in the  our beds.  Became more  somatically focused.  He was given some Flexeril and Ultram.  He claimed he  was hearing murmurs and seeing shadows.  Zyprexa did not seem to be helping  much.  He was placed on Risperdal 0.5 mg three times a day and 1 mg at  night.  We continued to work with these medications but he was complaining  of lightheadedness, for what we decreased the Risperdal and discontinued the  Risperdal during the day and optimized the dose at night.  By August 17th,  he was better.  He was in full contact with reality.  Endorsed no suicidal  ideation, no homicidal ideation.  There was no evidence of active delusional  hallucinations.  Increased insight.  He felt he was ready to go home.  He  was going to pursue further outpatient treatment.  Reported no side effects  to the medication.   DISCHARGE DIAGNOSES:   AXIS I:  1.  Major depression with psychotic features.  2.  Cocaine and marijuana abuse.   AXIS II:  No diagnosis.   AXIS III:  No diagnosis.   AXIS IV:  Moderate.   AXIS V:  Global Assessment of Functioning upon discharge 50-55.   DISCHARGE MEDICATIONS:  1.  Symmetrel 100 mg twice a day.  2.  Wellbutrin XL 300 mg daily.  3.  Risperdal 1 mg at night.  4.  Flexeril 10 mg, 1/2 three times a day as needed.  5.  Ambien 10 mg, 1 at bedtime as needed for sleep.  6.  Vistaril 25 mg every six hours as needed for anxiety.  7.  Trazodone 150 mg at bedtime.  8.  Ultram 50 mg, 1 every six hours as needed for pain.   FOLLOW UP:  [facility].                                               Madie Reno  Dub Mikes, M.D.    IL/MEDQ  D:  08/10/2004  T:  08/11/2004  Job:  [id number]

## 2011-04-21 NOTE — Discharge Summary (Signed)
NAME:  Trevor Newman, Trevor Newman NO.:  [id number]   MEDICAL RECORD NO.:  [id number]          PATIENT TYPE:  INP   LOCATION:  A339                          FACILITY:  [facility]   PHYSICIAN:  Dalia Heading, M.D.  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  He underwent exploratory laparotomy with duodenojejunostomy  on December 31, 2006.  He tolerated the procedure well.  His  postoperative course was remarkable for pain control.  He ended up  tolerating morphine the best.  He develop pruritus with multiple other  pain medications.  His diet was advanced without difficulty once his  bowel function returned.  The patient is being discharged home on  January 03, 2007, in good, improving condition.   DISCHARGE INSTRUCTIONS:  The patient is to follow up Dr. Franky Macho on  January 08, 2007.   DISCHARGE MEDICATIONS:  Include MS Contin 30 mg p.o. q.12h. p.r.n. pain,  Milk of Magnesia as needed for constipation.  He is not to take steroids  at this time.   PRINCIPAL DIAGNOSIS:  Malrotation of small intestine.   PRINCIPAL PROCEDURE:  Duodenojejunostomy on December 31, 2006.      Dalia Heading, M.D.  Electronically Signed     MAJ/MEDQ  D:  01/03/2007  T:  01/03/2007  Job:  [id number]   cc:   Lionel December, M.D.  [address]  [place]  [place] [address]   Ramon Dredge L. Juanetta Gosling, M.D.  Fax: [phone]

## 2011-04-21 NOTE — Op Note (Signed)
NAME:  Trevor, Newman                          ACCOUNT NO.:  [id number]   MEDICAL RECORD NO.:  [id number]                   PATIENT TYPE:  INP   LOCATION:  A328                                 FACILITY:  [facility]   PHYSICIAN:  Lionel December, M.D.                 DATE OF BIRTH:  [birth year]   DATE OF PROCEDURE:  10/07/2003  DATE OF DISCHARGE:  10/07/2003                                 OPERATIVE REPORT   PROCEDURE:  Esophagogastroduodenoscopy.   INDICATIONS FOR PROCEDURE:  Trevor Newman is a 55 year old Caucasian male who has  epigastric pain as well as heartburn and dysphagia. He had abdominal CT  primarily for right flank pain. It shows thickened distal esophagus. He is  undergoing diagnostic and therapeutic EGD. The procedure is reviewed with  the patient and informed consent was obtained.   PREOP MEDICATIONS:  Cetacaine spray for pharyngeal topical anesthesia,  Demerol 50 mg IV, Versed 12 mg IV in divided dose.   FINDINGS:  Procedure performed in endoscopy suite. The patient's vital signs  and O2 sat were monitored during the procedure and remained stable. The  patient was placed in the left lateral decubitus position and the Olympus  video scope was passed through oropharynx without any difficulty into the  esophagus.   ESOPHAGUS:  The mucosa of the esophagus was normal. He had erythema at the  GE junction more on the gastric side. No hernia, ring or stricture was  noted.   STOMACH:  It was empty and distended very well with insufflation. There were  petechiae at the antrum along with patchy erythema and edema. No erosions or  ulcers were noted. The pyloric channel was patent. The angularis, fundus and  cardia were examined by retroflexing the scope and were normal.   DUODENUM:  Examination of the bulb revealed multiple erosions but no ulcer  crater was found. The scope was passed to the second and third part of the  duodenum and the mucosa and folds were normal. The endoscope was  withdrawn.   The esophagus was dilated by passing a 56 [place] Maloney dilator through the  esophagus completely. As the dilator was withdrawn, the endoscope was passed  again and the esophagus reexamined. There was no tear noted to the  esophagus. The patient tolerated the procedure well.   FINAL DIAGNOSIS:  1. Mild changes of reflux esophagitis limited to the GE junction. Esophagus     dilated given history of dysphagia.  2. Nonerosive gastritis.  3. Erosive bulbar duodenitis.    RECOMMENDATIONS:  He should continue with antireflux measures and PPI.  H.  pylori serology will be checked.      ___________________________________________  Lionel December, M.D.   NR/MEDQ  D:  10/07/2003  T:  10/08/2003  Job:  [id number]   cc:   Hanley Hays. Dechurch, M.D.  [address]  [place]  [place] [address]  Fax: [phone]

## 2011-04-21 NOTE — Procedures (Signed)
NAMEROMULO, OKRAY NO.:  [id number]   MEDICAL RECORD NO.:  [id number]          PATIENT TYPE:  INP   LOCATION:  A218                          FACILITY:  [facility]   PHYSICIAN:  Vida Roller, M.D.   DATE OF BIRTH:  04/19/1956   DATE OF PROCEDURE:  05/14/2006  DATE OF DISCHARGE:                                  ECHOCARDIOGRAM   TAPE:  #[id number], tape count [id number] through 6001.   PRIMARY CARE PHYSICIAN:  Dr. Rito Ehrlich.   PROCEDURE:  Echocardiogram.   INDICATIONS FOR PROCEDURE:  Chest discomfort.   DESCRIPTION OF PROCEDURE:  The technical quality of the study is adequate.   M-mode tracings:  aorta is 24 mm.   left atrium is 35 mm.   septum is 10 mm.   posterior wall is 10 mm.   diastolic dimension is 45 mm.   systolic dimension is 34 mm.   Two-D and Doppler imaging:   The left ventricle is normal size.  The left ventricular systolic function  is normal at 50%-55%.  There are no wall motion abnormalities.   The right ventricle is of normal size with normal systolic function.   Both atria appear to be of normal size.   The aortic valve is without significant stenosis.  There is trivial  regurgitation seen in only one view.   The mitral valve appears to have just very mild thickening and there is  trivial regurgitation.  No stenosis is seen.   The tricuspid valve has mild regurgitation.   There is no pericardial effusion.   The inferior vena cava appears to be of normal size.      Vida Roller, M.D.  Electronically Signed     JH/MEDQ  D:  05/14/2006  T:  05/14/2006  Job:  [id number]

## 2011-04-21 NOTE — Consult Note (Signed)
NAME:  Trevor Newman, Trevor Newman NO.:  [id number]   MEDICAL RECORD NO.:  [id number]          PATIENT TYPE:  INP   LOCATION:  A218                          FACILITY:  [facility]   PHYSICIAN:  R. Roetta Sessions, M.D. DATE OF BIRTH:  1956-05-20   DATE OF CONSULTATION:  DATE OF DISCHARGE:                                   CONSULTATION   REQUESTING PHYSICIAN:  [facility].   REASON FOR CONSULTATION:  Dysphagia and chest pain.   HISTORY OF PRESENT ILLNESS:  Trevor Newman is a 55 year old Caucasian male who  reports over the last week he has had severe sharp pains in his chest. He  describes the pain as mid sternal, 8/10 and constant. He also has severe  odynophagia. He was seen at [facility]  last week and was admitted from June 7 to May 12, 2006. He had a cardiac  workup which was reportedly negative. He complains of nausea, denies any  emesis, he has a history of chronic GERD And erosive reflux esophagitis with  esophageal dysphagia and at least 2 dilatations in the last 5 years by Dr.  Karilyn Cota. He has been on Nexium 40 mg daily, he was on Protonix prior to this.  He has rare breakthrough heartburn and indigestion. He denies any anorexia.  He has complained of feeling like his food gets stuck mid sternally in one  spot but generally passes without any difficulty. He has problems with both  solids, liquids and saliva. He reports his bowel movements have been normal  once daily, soft and brown, denies any rectal bleeding or melena. He denies  any history of constipation or diarrhea.   His white blood cell count has 11% eosinophilia with WBCs 10.8 on admission  with absolute eosinophil count of 1200. He was started on Protonix 40 mg IV  q.24 hours.   PAST MEDICAL/SURGICAL HISTORY:  He was admitted to [facility] June 7 to May 12, 2006 to rule out ACS. He had a normal esophagram, he has chronic GERD. EGD  February 2002 by Dr. Karilyn Cota showed a small hiatal  hernia in the area of the  GE junction, gastritis with petechia, single antral erosion. He was  empirically dilated. He was H. pylori negative at that time. He had a CT  scan which showed distal esophageal thickening in November of 2004. He  underwent a subsequent EGD by Dr. Karilyn Cota which showed mild reflux  esophagitis at the GE junction, erosive bulbar duodenitis and nonerosive  gastritis. He was dilated with a 56 [place] Maloney dilator. He has a history  of depression, appendectomy, rotator cuff repair and back injury which  resulted in disability.   MEDICATIONS PRIOR TO ADMISSION:  1.  Nexium 40 mg daily.  2.  Nitroglycerin p.r.n.  3.  Imdur 30 mg daily.  4.  Tylenol p.r.n.   ALLERGIES:  CODEINE which causes itching.   FAMILY HISTORY:  There is no known family history of colorectal carcinoma,  lower chronic GI problems. Father deceased in his 55s secondary to lung  carcinoma, he was a  smoker. Mother alive and healthy at age 50. He has 5  brothers all of whom are healthy.   SOCIAL HISTORY:  Trevor Newman is currently engaged. He is disabled, previously  he was a Naval architect. He has a 33-pack year history of tobacco use, denies  any alcohol or drug use. He has one 52-year-old son who is healthy.   REVIEW OF SYSTEMS:  CONSTITUTIONAL:  Weight is stable. Denies any anorexia.  He has had some chills and hot flashes, denies any fever. CARDIOVASCULAR:  See HPI. PULMONARY:  He has had some nonproductive cough. His fiance states  he has had some sleep apnea type symptoms at night. GI:  See HPI.   PHYSICAL EXAMINATION:  VITAL SIGNS:  Weight 160.8 pounds, height 68 inches,  temp 96.9, pulse 48, respirations 20, blood pressure 105/59.  GENERAL:  Trevor Newman is a 55 year old Caucasian male whose alert and oriented,  pleasant, cooperative in no acute distress.  HEENT:  Normocephalic.  Sclerae are clear. Nonicteric. Conjunctiva pink.  Oropharynx pink and moist without lesions.  NECK:  Supple without  mass or thyromegaly.  CHEST:  Heart regular rate and rhythm. Normal S1, S2 without murmurs,  clicks, rubs or gallops.  LUNGS:  Clear to auscultation bilaterally.  ABDOMEN:  Flat with positive bowel sounds x4, no bruits auscultated. Soft,  nontender, nondistended without palpable mass or hepatosplenomegaly. No  rebound tenderness or guarding.  EXTREMITIES:  Without clubbing or edema bilaterally.  RECTAL:  Deferred.   LABORATORY DATA:  WBC is 12.4, hemoglobin 15.3, hematocrit 44.7, platelets  339, calcium 8.6, sodium 137, potassium 4.1, chloride 103, CO2 26, BUN 17,  creatinine 12, glucose 19, total bilirubin 0.7, alkaline phosphatase 63, AST  23, ALT 34. Total protein 5. __________ , albumin 3.3, total CK 61, CK-MB  0.5, troponin negative, BNP was negative.   IMPRESSION:  Trevor Newman is a 55 year old Caucasian male with a one week  history of severe constant chest pain. He was admitted to [facility] last week for 3 days with a negative  cardiac workup and negative barium esophagram. He has a history of chronic  gastroesophageal reflux disease with erosive reflux esophagitis and  dysphagia with at least two empirical dilatations previously by Dr. Karilyn Cota.  He has peripheral eosinophil and I suspect he may have eosinophilic  esophagitis given his chest pain, severe odynophagia. Other possibilities  include erosive esophagitis and less likely achalasia.   PLAN:  1.  Esophagogastroduodenoscopy with possible ED and biopsy for eosinophilia      by Dr. Jena Gauss soon. Consent to be obtained. I have discussed the      procedure including the risks and benefits which include but are not      limited to bleeding, infection, perforation, drug reaction, and the      patient agrees with plan.  2.  Continue proton pump inhibitor for now.  3.  Further recommendations to follow if this is eosinophilic esophagitis.     People generally response well with a course of  prednisone.   We would like to thank the [facility] team for allowing [place] to participate  in the care of Trevor Newman.      Nicholas Lose, N.P.      Jonathon Bellows, M.D.  Electronically Signed    KC/MEDQ  D:  05/14/2006  T:  05/14/2006  Job:  [id number]

## 2011-04-21 NOTE — H&P (Signed)
[name], [name] NO.:  [id number]   MEDICAL RECORD NO.:  [id number]          PATIENT TYPE:  IPS   LOCATION:  0501                          FACILITY:  [facility]   PHYSICIAN:  Trevor Jungling, MD  DATE OF BIRTH:  Sep 25, 1956   DATE OF ADMISSION:  06/23/2005  DATE OF DISCHARGE:                         PSYCHIATRIC ADMISSION ASSESSMENT   IDENTIFYING INFORMATION:  This is a voluntary admission to the services of  Dr. Anselm Newman.  [name] is a 55 year old widowed white male.  Apparently, he presented as a walk-in here at the [facility]  last evening after 5, complaining of increasing depression, decreased sleep  and suicidal ideation with no distinct plan.  He keeps thinking about his  wife who died three years ago.  He would like a pill to keep from crying and  get rid of these stupid thoughts.  He does acknowledge auditory  hallucinations consisting of mumbling, although occasionally they might be  saying kill yourself and visual hallucinations consisting of flashes.  He  was last admitted here in January 6th to January 16th.  He was discharged at  that time on Wellbutrin XL 300 mg q.a.m., trazodone 150 mg at 9 p.m.,  Provigil 200 mg q.a.m. and Seroquel 300 mg at 9 p.m. and Abilify 10 mg  q.a.m. and Strattera 25 mg q.a.m.  Apparently, in April, the last time he  has medications filled in [facility] in [place], he did continue to have the  Wellbutrin, Abilify, trazodone and Strattera.  Also the Seroquel.  Apparently, the pharmacy had problems filling the Provigil, although he  states that that did help him.  That was last filled on March 09, 2005 with  refill prescriptions from Dr. Thomasena Edis where he is followed at mental health.  The patient has since stopped his medications.  He stated the Seroquel was  causing leg cramping.  He does not explain why he stopped all his other  medications.   PAST PSYCHIATRIC HISTORY:  He has had several admissions  here, the most  recent ones are December 09, 2004 to December 19, 2004, September 23, 2004 to  September 30, 2004 and July 06, 2004 to July 20, 2004.  He has also been  admitted to [facility] and he is followed at [facility].   SOCIAL HISTORY:  He has a GED.  He used to be a Naval architect.  He has been  on disability for depression the past three years.  He states that he lives  alone.  He has no children.  When here in January, he was living with his  mother at that time. Not sure what happened to his mom.   ALCOHOL/DRUG HISTORY:  He acknowledges smoking one pack per day.  He denies  alcohol or drug use.  He does have a history for cocaine abuse as well as  polysubstance abuse but, when last here in January, was clean.   ALLERGIES:  He states SEROQUEL causes leg cramps and CODEINE causes itches.   PHYSICAL EXAMINATION:  He appears to be a well-developed, well-nourished,  white male with no acute findings.  Specifically, he has no known chronic  medical illnesses.   LABORATORY DATA:  Some of it is back.  His CBC was within normal limits.  His routine chemistries showed his sodium slightly low at 134 as well as  slightly low total protein at 5.7 and albumin at 3.1.  This would correlate  with not eating well.  His other labs are pending.   MENTAL STATUS EXAM:  He is alert and oriented x 3.  He is appropriately  groomed, dressed and nourished.  His gait and motor are normal.  He has poor  eye contact.  His speech is not pressured.  His mood is depressed, anxious  and his affect is congruent.  His thought processes are clear, rational and  goal-oriented.  He wants to feel better.  Judgment and insight are fair.  Concentration and memory are intact.  He does acknowledge auditory  hallucinations consisting of mumbling and visual hallucinations consisting  of flashes.  He is still suicidal, although he is not homicidal.   DIAGNOSES:   AXIS I:  1.  Major depressive  disorder, recurrent with psychotic features due to      noncompliance.  2.  He is specifically reporting auditory and visual hallucinations.  His      UDS for substances is pending.  He does have a history although one last      year in April was negative.   AXIS II:   AXIS III:  No known medical illnesses.   AXIS IV:  Moderate to severe (he is still citing grief over the loss of his  wife, limited support and he is disabled).   AXIS V:   PLAN:  He will be admitted.  Psychotropic medication will be restarted.  Toward that end, we will continue the Abilify since he has responded to that  before, the trazodone and will try the Provigil.       MD/MEDQ  D:  06/24/2005  T:  06/24/2005  Job:  [id number]

## 2011-04-21 NOTE — Group Therapy Note (Signed)
NAMEGILDO, CRISCO NO.:  [id number]   MEDICAL RECORD NO.:  [id number]          PATIENT TYPE:  INP   LOCATION:  A339                          FACILITY:  [facility]   PHYSICIAN:  Edward L. Juanetta Gosling, M.D.DATE OF BIRTH:  1956/07/16   DATE OF PROCEDURE:  DATE OF DISCHARGE:                                 PROGRESS NOTE   I am seeing Mr. Starke socially.  He does not have medical problems that  are requiring attention right now, although he does have medical  problems.  Today, he says he is feeling better.  He is going to go ahead  and have some grits and hopefully his abdomen will improve and he will  be able to be discharged fairly soon.  His right side pain has resolved.  He still has some incisional pain.      Edward L. Juanetta Gosling, M.D.  Electronically Signed     ELH/MEDQ  D:  01/02/2007  T:  01/02/2007  Job:  [id number]

## 2011-08-29 DIAGNOSIS — N183 Chronic kidney disease, stage 3 unspecified: Secondary | ICD-10-CM | POA: Insufficient documentation

## 2011-09-04 ENCOUNTER — Encounter: Payer: Self-pay | Admitting: Internal Medicine

## 2011-10-18 DIAGNOSIS — Z8719 Personal history of other diseases of the digestive system: Secondary | ICD-10-CM | POA: Insufficient documentation

## 2012-02-01 DIAGNOSIS — I2510 Atherosclerotic heart disease of native coronary artery without angina pectoris: Secondary | ICD-10-CM | POA: Insufficient documentation

## 2012-11-18 DIAGNOSIS — F419 Anxiety disorder, unspecified: Secondary | ICD-10-CM | POA: Insufficient documentation

## 2013-05-07 DIAGNOSIS — N400 Enlarged prostate without lower urinary tract symptoms: Secondary | ICD-10-CM | POA: Insufficient documentation

## 2013-08-07 ENCOUNTER — Encounter: Payer: Self-pay | Admitting: Neurology

## 2013-08-07 ENCOUNTER — Ambulatory Visit (INDEPENDENT_AMBULATORY_CARE_PROVIDER_SITE_OTHER): Payer: Medicare Other | Admitting: Neurology

## 2013-08-07 VITALS — BP 122/82 | HR 90 | Ht 67.0 in | Wt 218.0 lb

## 2013-08-07 DIAGNOSIS — M129 Arthropathy, unspecified: Secondary | ICD-10-CM

## 2013-08-07 DIAGNOSIS — R079 Chest pain, unspecified: Secondary | ICD-10-CM | POA: Insufficient documentation

## 2013-08-07 DIAGNOSIS — R7309 Other abnormal glucose: Secondary | ICD-10-CM

## 2013-08-07 DIAGNOSIS — E559 Vitamin D deficiency, unspecified: Secondary | ICD-10-CM | POA: Insufficient documentation

## 2013-08-07 DIAGNOSIS — I6529 Occlusion and stenosis of unspecified carotid artery: Secondary | ICD-10-CM | POA: Insufficient documentation

## 2013-08-07 DIAGNOSIS — E785 Hyperlipidemia, unspecified: Secondary | ICD-10-CM

## 2013-08-07 DIAGNOSIS — R569 Unspecified convulsions: Secondary | ICD-10-CM

## 2013-08-07 DIAGNOSIS — G43909 Migraine, unspecified, not intractable, without status migrainosus: Secondary | ICD-10-CM

## 2013-08-07 DIAGNOSIS — K219 Gastro-esophageal reflux disease without esophagitis: Secondary | ICD-10-CM | POA: Insufficient documentation

## 2013-08-07 DIAGNOSIS — R739 Hyperglycemia, unspecified: Secondary | ICD-10-CM | POA: Insufficient documentation

## 2013-08-07 DIAGNOSIS — Z720 Tobacco use: Secondary | ICD-10-CM | POA: Insufficient documentation

## 2013-08-07 DIAGNOSIS — R55 Syncope and collapse: Secondary | ICD-10-CM

## 2013-08-07 DIAGNOSIS — M1990 Unspecified osteoarthritis, unspecified site: Secondary | ICD-10-CM | POA: Insufficient documentation

## 2013-08-07 DIAGNOSIS — F17200 Nicotine dependence, unspecified, uncomplicated: Secondary | ICD-10-CM

## 2013-08-07 NOTE — Progress Notes (Signed)
[facility]  PATIENT: NOLBERTO CHEUVRONT DOB: 03/18/1956  HISTORICAL  Trevor Newman is a 57 y.o. male with a history of polycythemia, migraine headaches, he was admitted to the [facility] for evaluation of seizure.  He has no retraction of August 27th, when the event happened, and August 28th.  I was able to access records to [facility] system, He was brought to the ED after he developed dizziness and left sided weakness in August 27th 2014. The patient was initially awake and able to provide this history. He was repairing a scooter when he began to feel dizzy. He called EMS, and by the time they arrived, he couldn't stand and had weakness of his left arm. En route, he had a possible seizure witnessed by EMS with unresponsiveness and eye deviation (unknown which direction). He was awake and oriented upon arrival in the ED and a code purple was called. Initial NIHSS was 9, for left facial droop, left arm and leg weakness and numbness and mild dysarthria). CT head was unremarkable. During the stroke code, the patient had two seizures. Both started with left head and arm twitching, then had secondary generalization. Each lasted about 2 minutes and he was almost immediately awake afterwards. He was taken for MRI, which did not show an acute stroke. He has not had any further seizures since receiving 2 mg of Ativan.   He has been taking Xanax 1 mg 3 times a day for many years, there was no withdrawal  Patient was initially admitted to step down neurology unit. MRI and CT of his head were negative for acute process. EEG was normal. He underwent LP, cell count was normal. He had no further seizures since the initial presenting seizures. He was started on Keppra 750 mg twice daily. His left side weakness, numbness have improved significantly but still persist. He was seen by physical therapy, and home physical therapy is arranged.  REVIEW OF SYSTEMS: Full 14 system  review of systems performed and notable only for fatigue, blurred vision, memory loss, headaches, numbness, left-sided weakness, seizure, decreased energy  ALLERGIES: Allergies  Allergen Reactions  . Botox [Botulinum Toxin Type A]   . Codeine   . Hydrocodone   . Prednisone   . Tramadol     PAST MEDICAL HISTORY: Past Medical History  Diagnosis Date  . Seizure   . Chest pain   . Carotid artery stenosis   . Migraine   . Arthritis   . GERD (gastroesophageal reflux disease)   . Hyperlipidemia   . Tobacco abuse   . Vitamin D deficiency   . Hyperglycemia   . Tick bite of abdominal wall   . Syncope     PAST SURGICAL HISTORY: Past Surgical History  Procedure Laterality Date  . Gallbladder surgery    . Rotator cuff repair Right     FAMILY HISTORY: Family History  Problem Relation Age of Onset  . Skin cancer Father   . Lung cancer Father   . Diabetes Mother   . Kidney disease Mother     SOCIAL HISTORY:  History   Social History  . Marital Status: Widowed    Spouse Name: N/A    Number of Children: 1  . Years of Education: GED     Disabled due to chronic low back pain    Social History Main Topics  . Smoking status: Former Smoker    Types: Cigarettes  . Smokeless tobacco: Never Used  Comment: Quit 4 years ago  . Alcohol Use: 1.2 oz/week    2 Cans of beer per week     Comment: two beers once a month  . Drug Use: No     Comment: Quit 10 years ago.  Marland Kitchen Sexual Activity: Not on file    Social History Narrative   Patient lives at home with his girl friend. Patient is disabled. Patient has GED.    Right handed.   Caffeine- two cups of coffee daily.     PHYSICAL EXAM  Filed Vitals:   08/07/13 1004  BP: 122/82  Pulse: 90  Height: 5\' 7"  (1.702 m)  Weight: 218 lb (98.884 kg)   Body mass index is 34.14 kg/(m^2).   Generalized: In no acute distress  Neck: Supple, no carotid bruits   Cardiac: Regular rate rhythm  Pulmonary: Clear to auscultation  bilaterally  Musculoskeletal: No deformity  Neurological examination  Mentation: Alert oriented to time, place, history taking, and causual conversation  Cranial nerve II-XII: Pupils were equal round reactive to light extraocular movements were full, visual field were full on confrontational test. facial sensation and strength were normal. hearing was intact to finger rubbing bilaterally. Uvula tongue midline.  head turning and shoulder shrug and were normal and symmetric.Tongue protrusion into cheek strength was normal.  Motor: He has mild to moderate left leg weakness, left hip flexion 4 left in knee extension 4, left knee flexion 4 plus, left ankle dorsiflexion 4  Sensory: decreased fine touch, pinprick at left arm, leg  Coordination: Normal finger to nose, heel-to-shin bilaterally there was no truncal ataxia  Gait: dragging left leg, mildly unsteady.  Romberg signs: Negative  Deep tendon reflexes: Brachioradialis 2/2, biceps 2/2, triceps 2/2, patellar 2/2, Achilles 2/2, plantar responses were flexor at right, extensor at left.   DIAGNOSTIC DATA (LABS, IMAGING, TESTING) - I reviewed patient records, labs, notes, testing and imaging myself where available.    ASSESSMENT AND PLAN    57 years old right-handed Caucasian male, with past medical history of left internal carotid artery high-grade stenosis, status post stent placement, presenting with recurrent seizure, suggestive of complex partial seizure, with residual left-sided numbness and weakness, MRI of the brain showed no acute findings, EEG was normal,  1 continue Keppra 750 mg twice a day 2. Home physical therapy 3. Return to clinic with Eber Jones in 4-6 months             Levert Feinstein, M.D. Ph.D.  [facility] [address], [address] [place], [place] [address] [phone]

## 2015-03-06 DIAGNOSIS — M87059 Idiopathic aseptic necrosis of unspecified femur: Secondary | ICD-10-CM | POA: Insufficient documentation

## 2019-06-25 ENCOUNTER — Other Ambulatory Visit: Payer: Self-pay | Admitting: Otolaryngology

## 2019-06-30 ENCOUNTER — Other Ambulatory Visit: Payer: Self-pay

## 2019-06-30 ENCOUNTER — Encounter (HOSPITAL_BASED_OUTPATIENT_CLINIC_OR_DEPARTMENT_OTHER): Payer: Self-pay | Admitting: *Deleted

## 2019-07-01 ENCOUNTER — Other Ambulatory Visit (HOSPITAL_COMMUNITY)
Admission: RE | Admit: 2019-07-01 | Discharge: 2019-07-01 | Disposition: A | Discharge status: Home / Self Care | Payer: Medicare Other | Source: Ambulatory Visit | Attending: Otolaryngology | Admitting: Otolaryngology

## 2019-07-01 DIAGNOSIS — Z20828 Contact with and (suspected) exposure to other viral communicable diseases: Secondary | ICD-10-CM | POA: Diagnosis present

## 2019-07-01 LAB — SARS CORONAVIRUS 2 (TAT 6-24 HRS): SARS Coronavirus 2: NEGATIVE

## 2019-07-04 ENCOUNTER — Other Ambulatory Visit: Payer: Self-pay

## 2019-07-04 ENCOUNTER — Encounter (HOSPITAL_BASED_OUTPATIENT_CLINIC_OR_DEPARTMENT_OTHER): Payer: Self-pay | Admitting: Certified Registered"

## 2019-07-04 ENCOUNTER — Encounter (HOSPITAL_BASED_OUTPATIENT_CLINIC_OR_DEPARTMENT_OTHER)
Admission: RE | Disposition: A | Discharge status: Home / Self Care | Payer: Self-pay | Source: Home / Self Care | Attending: Otolaryngology

## 2019-07-04 ENCOUNTER — Ambulatory Visit (HOSPITAL_BASED_OUTPATIENT_CLINIC_OR_DEPARTMENT_OTHER)
Admission: RE | Admit: 2019-07-04 | Discharge: 2019-07-04 | Disposition: A | Discharge status: Home / Self Care | Payer: Medicare Other | Attending: Otolaryngology | Admitting: Otolaryngology

## 2019-07-04 ENCOUNTER — Ambulatory Visit (HOSPITAL_BASED_OUTPATIENT_CLINIC_OR_DEPARTMENT_OTHER): Payer: Medicare Other | Admitting: Anesthesiology

## 2019-07-04 DIAGNOSIS — I739 Peripheral vascular disease, unspecified: Secondary | ICD-10-CM | POA: Insufficient documentation

## 2019-07-04 DIAGNOSIS — K219 Gastro-esophageal reflux disease without esophagitis: Secondary | ICD-10-CM | POA: Diagnosis not present

## 2019-07-04 DIAGNOSIS — Z7982 Long term (current) use of aspirin: Secondary | ICD-10-CM | POA: Diagnosis not present

## 2019-07-04 DIAGNOSIS — F419 Anxiety disorder, unspecified: Secondary | ICD-10-CM | POA: Diagnosis not present

## 2019-07-04 DIAGNOSIS — J449 Chronic obstructive pulmonary disease, unspecified: Secondary | ICD-10-CM | POA: Diagnosis not present

## 2019-07-04 DIAGNOSIS — G43909 Migraine, unspecified, not intractable, without status migrainosus: Secondary | ICD-10-CM | POA: Insufficient documentation

## 2019-07-04 DIAGNOSIS — G4733 Obstructive sleep apnea (adult) (pediatric): Secondary | ICD-10-CM

## 2019-07-04 DIAGNOSIS — Z87891 Personal history of nicotine dependence: Secondary | ICD-10-CM | POA: Insufficient documentation

## 2019-07-04 DIAGNOSIS — Z79899 Other long term (current) drug therapy: Secondary | ICD-10-CM | POA: Insufficient documentation

## 2019-07-04 DIAGNOSIS — Z7951 Long term (current) use of inhaled steroids: Secondary | ICD-10-CM | POA: Diagnosis not present

## 2019-07-04 DIAGNOSIS — E785 Hyperlipidemia, unspecified: Secondary | ICD-10-CM | POA: Diagnosis not present

## 2019-07-04 HISTORY — DX: Unspecified asthma, uncomplicated: J45.909

## 2019-07-04 HISTORY — PX: DRUG INDUCED ENDOSCOPY: SHX6808

## 2019-07-04 HISTORY — DX: Other chronic pain: G89.29

## 2019-07-04 HISTORY — DX: Anxiety disorder, unspecified: F41.9

## 2019-07-04 HISTORY — DX: Sleep apnea, unspecified: G47.30

## 2019-07-04 HISTORY — DX: Chronic obstructive pulmonary disease, unspecified: J44.9

## 2019-07-04 SURGERY — DRUG INDUCED SLEEP ENDOSCOPY
Anesthesia: General | Site: Nose

## 2019-07-04 MED ORDER — PROPOFOL 500 MG/50ML IV EMUL
INTRAVENOUS | Status: DC | PRN
  Administered 2019-07-04: 35 ug/kg/min via INTRAVENOUS

## 2019-07-04 MED ORDER — SCOPOLAMINE 1 MG/3DAYS TD PT72: 1.0000 | MEDICATED_PATCH | Freq: Once | TRANSDERMAL | Status: DC

## 2019-07-04 MED ORDER — ACETAMINOPHEN 325 MG PO TABS: 325.0000 mg | ORAL_TABLET | ORAL | Status: DC | PRN

## 2019-07-04 MED ORDER — FENTANYL CITRATE (PF) 100 MCG/2ML IJ SOLN: 50.0000 ug | INTRAMUSCULAR | Status: DC | PRN

## 2019-07-04 MED ORDER — MIDAZOLAM HCL 2 MG/2ML IJ SOLN: 1.0000 mg | INTRAMUSCULAR | Status: DC | PRN

## 2019-07-04 MED ORDER — MEPERIDINE HCL 25 MG/ML IJ SOLN: 6.2500 mg | INTRAMUSCULAR | Status: DC | PRN

## 2019-07-04 MED ORDER — FENTANYL CITRATE (PF) 100 MCG/2ML IJ SOLN: 25.0000 ug | INTRAMUSCULAR | Status: DC | PRN

## 2019-07-04 MED ORDER — LIDOCAINE HCL (CARDIAC) PF 100 MG/5ML IV SOSY
PREFILLED_SYRINGE | INTRAVENOUS | Status: DC | PRN
  Administered 2019-07-04: 60 mg via INTRAVENOUS

## 2019-07-04 MED ORDER — ACETAMINOPHEN 160 MG/5ML PO SOLN: 325.0000 mg | ORAL | Status: DC | PRN

## 2019-07-04 MED ORDER — LACTATED RINGERS IV SOLN
INTRAVENOUS | Status: DC
  Administered 2019-07-04: 09:00:00 via INTRAVENOUS

## 2019-07-04 MED ORDER — ONDANSETRON HCL 4 MG/2ML IJ SOLN: 4.0000 mg | Freq: Once | INTRAMUSCULAR | Status: DC | PRN

## 2019-07-04 MED ORDER — CHLORHEXIDINE GLUCONATE CLOTH 2 % EX PADS: 6.0000 | MEDICATED_PAD | Freq: Once | CUTANEOUS | Status: DC

## 2019-07-04 SURGICAL SUPPLY — 16 items
CANISTER SUCT 1200ML W/VALVE (MISCELLANEOUS) ×3 IMPLANT
COVER WAND RF STERILE (DRAPES) IMPLANT
DRAPE HALF SHEET 70X43 (DRAPES) ×3 IMPLANT
GLOVE BIOGEL M 7.0 STRL (GLOVE) ×3 IMPLANT
GLOVE BIOGEL PI IND STRL 7.0 (GLOVE) ×1 IMPLANT
GLOVE BIOGEL PI INDICATOR 7.0 (GLOVE) ×2
KIT CLEAN ENDO (MISCELLANEOUS) ×3 IMPLANT
NEEDLE PRECISIONGLIDE 27X1.5 (NEEDLE) IMPLANT
PACK BASIN DAY SURGERY FS (CUSTOM PROCEDURE TRAY) ×3 IMPLANT
PATTIES SURGICAL .5 X3 (DISPOSABLE) IMPLANT
SOLUTION ANTI FOG 6CC (MISCELLANEOUS) ×3 IMPLANT
SPONGE NEURO XRAY DETECT 1X3 (DISPOSABLE) IMPLANT
SYR CONTROL 10ML LL (SYRINGE) IMPLANT
TOWEL GREEN STERILE FF (TOWEL DISPOSABLE) ×3 IMPLANT
TUBE CONNECTING 20'X1/4 (TUBING)
TUBE CONNECTING 20X1/4 (TUBING) IMPLANT

## 2019-07-04 NOTE — Anesthesia Postprocedure Evaluation (Signed)
Anesthesia Post Note  Patient: Trevor Newman  Procedure(s) Performed: DRUG INDUCED SLEEP ENDOSCOPY (N/A Nose)     Patient location during evaluation: Phase II Anesthesia Type: General Level of consciousness: awake Pain management: pain level controlled Vital Signs Assessment: post-procedure vital signs reviewed and stable Respiratory status: spontaneous breathing Cardiovascular status: stable Postop Assessment: no apparent nausea or vomiting Anesthetic complications: no    Last Vitals:  Vitals:   07/04/19 0945 07/04/19 0954  BP: 124/81 130/87  Pulse: 81 78  Resp: 14 16  Temp:  36.6 C  SpO2: 97% 98%    Last Pain:  Vitals:   07/04/19 0954  TempSrc:   PainSc: 0-No pain   Pain Goal: Patients Stated Pain Goal: 2 (07/04/19 0818)                 Huston Foley

## 2019-07-04 NOTE — Transfer of Care (Signed)
Immediate Anesthesia Transfer of Care Note  Patient: Trevor Newman  Procedure(s) Performed: DRUG INDUCED SLEEP ENDOSCOPY (N/A Nose)  Patient Location: PACU  Anesthesia Type:MAC  Level of Consciousness: awake, alert , oriented and patient cooperative  Airway & Oxygen Therapy: Patient Spontanous Breathing and Patient connected to face mask oxygen  Post-op Assessment: Report given to RN and Post -op Vital signs reviewed and stable  Post vital signs: Reviewed and stable  Last Vitals:  Vitals Value Taken Time  BP    Temp    Pulse 84 07/04/19 0911  Resp    SpO2 100 % 07/04/19 0911  Vitals shown include unvalidated device data.  Last Pain:  Vitals:   07/04/19 0818  TempSrc: Oral  PainSc: 2       Patients Stated Pain Goal: 2 (29/79/89 2119)  Complications: No apparent anesthesia complications

## 2019-07-04 NOTE — Anesthesia Procedure Notes (Signed)
Procedure Name: MAC Date/Time: 07/04/2019 9:13 AM Performed by: Signe Colt, CRNA Pre-anesthesia Checklist: Patient identified, Emergency Drugs available, Suction available, Patient being monitored and Timeout performed Patient Re-evaluated:Patient Re-evaluated prior to induction Oxygen Delivery Method: Simple face mask

## 2019-07-04 NOTE — H&P (Signed)
Trevor Newman is an 64 y.o. male.   Chief Complaint: OSA HPI: Hx of OSA with CPAP difficulty  Past Medical History:  Diagnosis Date  . Anxiety   . Arthritis   . Asthma   . Carotid artery stenosis   . Chest pain   . Chronic back pain    on Dilaudid  . COPD (chronic obstructive pulmonary disease) ([place])   . GERD (gastroesophageal reflux disease)   . Hyperglycemia   . Hyperlipidemia   . Migraine   . Seizure ([place])   . Sleep apnea    no CPAP  . Syncope   . Tick bite of abdominal wall   . Tobacco abuse   . Vitamin D deficiency     Past Surgical History:  Procedure Laterality Date  . COLON SURGERY    . GALLBLADDER SURGERY    . ROTATOR CUFF REPAIR Right     Family History  Problem Relation Age of Onset  . Diabetes Mother   . Kidney disease Mother   . Skin cancer Father   . Lung cancer Father    Social History:  reports that he has quit smoking. His smoking use included cigarettes. He has never used smokeless tobacco. He reports current alcohol use of about 2.0 standard drinks of alcohol per week. He reports that he does not use drugs.  Allergies:  Allergies  Allergen Reactions  . Botox [Botulinum Toxin Type A]   . Codeine   . Hydrocodone   . Prednisone   . Tramadol     Medications Prior to Admission  Medication Sig Dispense Refill  . ALPRAZolam (XANAX) 1 MG tablet Take 0.5 mg by mouth 2 (two) times daily as needed.     Marland Kitchen aspirin 325 MG tablet Take 325 mg by mouth daily.    Marland Kitchen atorvastatin (LIPITOR) 20 MG tablet Take 20 mg by mouth daily.    . budesonide-formoterol (SYMBICORT) 160-4.5 MCG/ACT inhaler Inhale 2 puffs into the lungs 2 (two) times daily.    . Cetirizine HCl 10 MG CAPS Take by mouth daily.    . cyclobenzaprine (FLEXERIL) 10 MG tablet Take 10 mg by mouth 3 (three) times daily as needed for muscle spasms.    . DULoxetine (CYMBALTA) 60 MG capsule Take 60 mg by mouth daily.    . fenofibrate (TRICOR) 48 MG tablet Take 48 mg by mouth daily.    Marland Kitchen  HYDROmorphone (DILAUDID) 4 MG tablet Take by mouth every 4 (four) hours as needed for severe pain.    Marland Kitchen LIDODERM 5 %     . NEXIUM 40 MG capsule Take 40 mg by mouth daily.    . polyethylene glycol powder (GLYCOLAX/MIRALAX) powder     . PROAIR HFA 108 (90 BASE) MCG/ACT inhaler     . tamsulosin (FLOMAX) 0.4 MG CAPS capsule Take 0.4 mg by mouth.    . VOLTAREN 1 % GEL     . SUMAtriptan (IMITREX) 100 MG tablet Take 100 mg by mouth daily.      No results found for this or any previous visit (from the past 48 hour(s)). No results found.  Review of Systems  Constitutional: Negative.   HENT: Negative.     Height 5\' 8"  (1.727 m), weight 93 kg. Physical Exam  Constitutional: He appears well-developed and well-nourished.  Neck: Normal range of motion.  Respiratory: Effort normal.     Assessment/Plan Adm for OP DISE  Jerrell Belfast, MD 07/04/2019, 8:08 AM

## 2019-07-04 NOTE — Op Note (Signed)
Operative Note: DRUG INDUCED SLEEP ENDOSCOPY  Patient: Trevor Newman record number: [id number]  Date:07/04/2019  Pre-operative Indications: 1.  Obstructive Sleep Apnea  Postoperative Indications: Same  Surgical Procedure: 1.  Drug Induced Sleep Endoscopy (DISE)  Anesthesia: MAC with IV sedation  Surgeon: Delsa Bern, M.D.  Complications: None  EBL: None  Findings: There is no evidence of complete concentric palatal obstruction.  Anatomically the patient should be a candidate for hypoglossal nerve stimulation therapy.     Brief History: The patient is a 63 y.o. male with a history of obstructive sleep apnea. The patient has undergone previous sleep study which showed mild to moderate levels of obstructive sleep apnea.  The patient was prescribed CPAP which they consistently attempted to use without success.  Given the patient's history and findings, the above drug-induced sleep endoscopy was recommended to assess the patient's anatomic level of apnea.  Risks and benefits were discussed in detail with the patient today understand and agree with our plan for surgery which is scheduled at [facility] under sedated anesthesia as an outpatient.  Surgical Procedure: The patient is brought to the operating room on 07/04/2019 and placed in supine position on the operating table.  Intravenous sedated anesthesia was established without difficulty using the standard drug-induced sleep endoscopy protocol. When the patient was adequately anesthetized, surgical timeout was performed and correct identification of the patient and the surgical procedure.    A propofol infusion was administered and the patient was monitored carefully to achieve a level of sedation appropriate for DISE.  The patient did not respond to verbal commands but still had spontaneous respiration, sleep disordered breathing and associated desaturations were observed.  With the patient under adequate sedated anesthesia the  flexible nasal laryngoscope was passed without difficulty.  The patient's nasal cavity showed no obstruction.  The endoscope was then passed to visualize the velopharynx, oropharynx, tongue base and epiglottis to assess areas of obstruction.  Patient's airway showed primary anterior to posterior obstruction, minimal lateral collapse.   There was no evidence of complete concentric palatal obstruction and the patient appeared to be a candidate anatomically for hypoglossal nerve stimulation therapy.  Surgical sponge count was correct. Patient was awakened from anesthetic and transferred from the operating room to the recovery room in stable condition. There were no complications and no blood loss.   Delsa Bern, M.D. [facility] 07/04/2019

## 2019-07-04 NOTE — Anesthesia Preprocedure Evaluation (Addendum)
Anesthesia Evaluation  Patient identified by MRN, date of birth, ID band Patient awake    Reviewed: Allergy & Precautions, NPO status , Patient's Chart, lab work & pertinent test results  Airway Mallampati: I       Dental no notable dental hx. (+) Teeth Intact   Pulmonary sleep apnea , former smoker,    Pulmonary exam normal breath sounds clear to auscultation       Cardiovascular + Peripheral Vascular Disease  Normal cardiovascular exam Rhythm:Regular Rate:Normal     Neuro/Psych PSYCHIATRIC DISORDERS Anxiety    GI/Hepatic Neg liver ROS, GERD  Medicated and Controlled,  Endo/Other  negative endocrine ROS  Renal/GU negative Renal ROS  negative genitourinary   Musculoskeletal   Abdominal Normal abdominal exam  (+)   Peds  Hematology negative hematology ROS (+)   Anesthesia Other Findings   Reproductive/Obstetrics                             Anesthesia Physical Anesthesia Plan  ASA: II  Anesthesia Plan: General   Post-op Pain Management:    Induction:   PONV Risk Score and Plan: 2 and Propofol infusion  Airway Management Planned: Natural Airway  Additional Equipment:   Intra-op Plan:   Post-operative Plan:   Informed Consent: I have reviewed the patients History and Physical, chart, labs and discussed the procedure including the risks, benefits and alternatives for the proposed anesthesia with the patient or authorized representative who has indicated his/her understanding and acceptance.       Plan Discussed with:   Anesthesia Plan Comments:        Anesthesia Quick Evaluation

## 2019-07-04 NOTE — Discharge Instructions (Signed)

## 2019-07-07 ENCOUNTER — Encounter (HOSPITAL_BASED_OUTPATIENT_CLINIC_OR_DEPARTMENT_OTHER): Payer: Self-pay | Admitting: Otolaryngology

## 2019-12-05 DIAGNOSIS — U071 COVID-19: Secondary | ICD-10-CM

## 2019-12-05 HISTORY — DX: COVID-19: U07.1

## 2020-01-06 DIAGNOSIS — M4722 Other spondylosis with radiculopathy, cervical region: Secondary | ICD-10-CM | POA: Insufficient documentation

## 2021-02-24 ENCOUNTER — Encounter: Payer: Self-pay | Admitting: Emergency Medicine

## 2021-02-24 ENCOUNTER — Emergency Department (INDEPENDENT_AMBULATORY_CARE_PROVIDER_SITE_OTHER)
Admission: EM | Admit: 2021-02-24 | Discharge: 2021-02-24 | Disposition: A | Discharge status: Home / Self Care | Payer: Medicare Other | Source: Home / Self Care

## 2021-02-24 DIAGNOSIS — L03311 Cellulitis of abdominal wall: Secondary | ICD-10-CM

## 2021-02-24 DIAGNOSIS — R110 Nausea: Secondary | ICD-10-CM

## 2021-02-24 DIAGNOSIS — S30861A Insect bite (nonvenomous) of abdominal wall, initial encounter: Secondary | ICD-10-CM

## 2021-02-24 DIAGNOSIS — W57XXXA Bitten or stung by nonvenomous insect and other nonvenomous arthropods, initial encounter: Secondary | ICD-10-CM

## 2021-02-24 MED ORDER — TRIAMCINOLONE ACETONIDE 0.1 % EX CREA: 1.0000 "application " | TOPICAL_CREAM | Freq: Two times a day (BID) | CUTANEOUS | 0 refills | Status: DC

## 2021-02-24 MED ORDER — DOXYCYCLINE HYCLATE 100 MG PO CAPS: 100.0000 mg | ORAL_CAPSULE | Freq: Two times a day (BID) | ORAL | 0 refills | Status: DC

## 2021-02-24 NOTE — ED Triage Notes (Addendum)
Pt pulled a tick off his lower abd 5 days ago  Now has nausea the last couple of days  COVID between 1st & 2nd vaccines Hx of [facility] Spotted Fever

## 2021-02-24 NOTE — Discharge Instructions (Addendum)
I have sent in doxycycline for you to take one tablet twice a day for 7 days  I have sent in triamcinolone cream for you to use to the area twice a day as needed.  Follow up with this office or with primary care if symptoms are persisting.  Follow up in the ER for high fever, trouble swallowing, trouble breathing, other concerning symptoms.  

## 2021-02-24 NOTE — ED Provider Notes (Signed)
[facility]   [id number] 02/24/21 Arrival Time: 1527  CC: RASH  SUBJECTIVE:  Trevor Newman is a 65 y.o. male who presents with a skin complaint that began about 3 days ago. Also reports nausea that has been present for the last day. Reports that he was bitten by a tick on his abdomen and the area is now red, itchy, warm, and painful. Has not attempted OTC treatment.  Itching and pain are worse when his pants and belt rub against the area. Reports tick bite in the past with [facility] Fever as well. Denies fever, chills, vomiting, discharge, oral lesions, SOB, chest pain, abdominal pain, changes in bowel or bladder function.    ROS: As per HPI.  All other pertinent ROS negative.     Past Medical History:  Diagnosis Date  . Anxiety   . Arthritis   . Asthma   . Carotid artery stenosis   . Chest pain   . Chronic back pain    on Dilaudid  . COPD (chronic obstructive pulmonary disease) (HCC)   . COVID-19 2021  . GERD (gastroesophageal reflux disease)   . Hyperglycemia   . Hyperlipidemia   . Migraine   . Seizure (HCC)   . Sleep apnea    no CPAP  . Syncope   . Tick bite of abdominal wall   . Tobacco abuse   . Vitamin D deficiency    Past Surgical History:  Procedure Laterality Date  . COLON SURGERY    . DRUG INDUCED ENDOSCOPY N/A 07/04/2019   Procedure: DRUG INDUCED SLEEP ENDOSCOPY;  Surgeon: Osborn Coho, MD;  Location: [facility];  Service: ENT;  Laterality: N/A;  . GALLBLADDER SURGERY    . ROTATOR CUFF REPAIR Right    Allergies  Allergen Reactions  . Botox [Botulinum Toxin Type A]   . Codeine   . Hydrocodone   . Prednisone   . Tramadol    No current facility-administered medications on file prior to encounter.   Current Outpatient Medications on File Prior to Encounter  Medication Sig Dispense Refill  . ALPRAZolam (XANAX) 1 MG tablet Take 0.5 mg by mouth 2 (two) times daily as needed.     Marland Kitchen aspirin 325 MG tablet Take 325 mg  by mouth daily.    Marland Kitchen atorvastatin (LIPITOR) 20 MG tablet Take 20 mg by mouth daily.    . budesonide-formoterol (SYMBICORT) 160-4.5 MCG/ACT inhaler Inhale 2 puffs into the lungs 2 (two) times daily.    . Cetirizine HCl 10 MG CAPS Take by mouth daily.    . cyclobenzaprine (FLEXERIL) 10 MG tablet Take 10 mg by mouth 3 (three) times daily as needed for muscle spasms.    . DULoxetine (CYMBALTA) 60 MG capsule Take 60 mg by mouth daily.    . fenofibrate (TRICOR) 48 MG tablet Take 48 mg by mouth daily.    Marland Kitchen HYDROmorphone (DILAUDID) 4 MG tablet Take by mouth every 4 (four) hours as needed for severe pain.    Marland Kitchen LIDODERM 5 %     . NEXIUM 40 MG capsule Take 40 mg by mouth daily.    . polyethylene glycol powder (GLYCOLAX/MIRALAX) powder     . PROAIR HFA 108 (90 BASE) MCG/ACT inhaler     . SUMAtriptan (IMITREX) 100 MG tablet Take 100 mg by mouth daily.    . tamsulosin (FLOMAX) 0.4 MG CAPS capsule Take 0.4 mg by mouth.    . VOLTAREN 1 % GEL  Social History   Socioeconomic History  . Marital status: Widowed    Spouse name: Not on file  . Number of children: 1  . Years of education: GED  . Highest education level: Not on file  Occupational History    Comment: Disabled  Tobacco Use  . Smoking status: Former Smoker    Types: Cigarettes  . Smokeless tobacco: Never Used  . Tobacco comment: Quit 4 years ago  Substance and Sexual Activity  . Alcohol use: Not Currently    Alcohol/week: 2.0 standard drinks    Types: 2 Cans of beer per week    Comment: two beers once a month  . Drug use: No    Types: Cocaine    Comment: Quit 10 years ago.  Marland Kitchen Sexual activity: Not on file  Other Topics Concern  . Not on file  Social History Narrative   Patient lives at home with his girl friend. Patient is disabled. Patient has GED.    Right handed.   Caffeine- two cups of coffee daily.   Social Determinants of Health   Financial Resource Strain: Not on file  Food Insecurity: Not on file  Transportation  Needs: Not on file  Physical Activity: Not on file  Stress: Not on file  Social Connections: Not on file  Intimate Partner Violence: Not on file   Family History  Problem Relation Age of Onset  . Diabetes Mother   . Kidney disease Mother   . Skin cancer Father   . Lung cancer Father     OBJECTIVE: Vitals:   02/24/21 1538 02/24/21 1542  BP: 132/85   Pulse: 88   Resp: 17   Temp: 98.7 F (37.1 C)   TempSrc: Oral   SpO2: 97%   Weight:  202 lb 13.2 oz (92 kg)  Height:  5\' 8"  (1.727 m)    General appearance: alert; no distress Head: NCAT Lungs: clear to auscultation bilaterally Heart: regular rate and rhythm.  Radial pulse 2+ bilaterally Extremities: no edema Skin: warm and dry; 3cm diameter area of erythema, warmth, tenderness with central scab, no drainage noted, no bleeding, no bulls-eye shaped rash noted, no foreign body appreciated Psychological: alert and cooperative; normal mood and affect  ASSESSMENT & PLAN:  1. Cellulitis of abdominal wall   2. Insect bite of abdominal wall, initial encounter   3. Nausea without vomiting     Meds ordered this encounter  Medications  . doxycycline (VIBRAMYCIN) 100 MG capsule    Sig: Take 1 capsule (100 mg total) by mouth 2 (two) times daily.    Dispense:  14 capsule    Refill:  0    Order Specific Question:   Supervising Provider    Answer:   Merrilee Jansky  . triamcinolone (KENALOG) 0.1 %    Sig: Apply 1 application topically 2 (two) times daily.    Dispense:  30 g    Refill:  0    Order Specific Question:   Supervising Provider    Answer:   [id number] Merrilee Jansky    Prescribed doxycycline 100mg  BID x 7 days Prescribed triamcinolone cream Take as prescribed and to completion Avoid hot showers/ baths Moisturize skin daily  Follow up with this office or with PCP if symptoms are persisting at the end of the course of doxycycline, may need labs for RMSF and Lyme disease Return or go to the ER if you  have any new or worsening symptoms such as fever, chills, nausea, vomiting,  redness, swelling, discharge, if symptoms do not improve with medications  Reviewed expectations re: course of current medical issues. Questions answered. Outlined signs and symptoms indicating need for more acute intervention. Patient verbalized understanding. After Visit Summary given.   Moshe Cipro, NP 02/24/21 [phone]

## 2022-02-27 ENCOUNTER — Emergency Department (INDEPENDENT_AMBULATORY_CARE_PROVIDER_SITE_OTHER)
Admission: EM | Admit: 2022-02-27 | Discharge: 2022-02-27 | Disposition: A | Discharge status: Home / Self Care | Payer: Medicare Other | Source: Home / Self Care | Attending: Family Medicine | Admitting: Family Medicine

## 2022-02-27 ENCOUNTER — Encounter: Payer: Self-pay | Admitting: Emergency Medicine

## 2022-02-27 ENCOUNTER — Emergency Department (INDEPENDENT_AMBULATORY_CARE_PROVIDER_SITE_OTHER): Payer: Medicare Other

## 2022-02-27 DIAGNOSIS — M5441 Lumbago with sciatica, right side: Secondary | ICD-10-CM

## 2022-02-27 DIAGNOSIS — R0781 Pleurodynia: Secondary | ICD-10-CM

## 2022-02-27 DIAGNOSIS — G894 Chronic pain syndrome: Secondary | ICD-10-CM

## 2022-02-27 MED ORDER — KETOROLAC TROMETHAMINE 30 MG/ML IJ SOLN
30.0000 mg | Freq: Once | INTRAMUSCULAR | Status: AC
  Administered 2022-02-27: 30 mg via INTRAMUSCULAR

## 2022-02-27 MED ORDER — TIZANIDINE HCL 4 MG PO TABS: 4.0000 mg | ORAL_TABLET | Freq: Four times a day (QID) | ORAL | 0 refills | Status: DC | PRN

## 2022-02-27 MED ORDER — METHYLPREDNISOLONE 4 MG PO TBPK: ORAL_TABLET | ORAL | 0 refills | Status: DC

## 2022-02-27 NOTE — Discharge Instructions (Signed)
Reduce activity while back is painful ?Take the Medrol Dosepak as directed.  This is a steroid.  Take all of day 1 today ?Take tizanidine as needed as muscle relaxer.  Take this instead of Flexeril. ?See your pain doctor on Wednesday ?

## 2022-02-27 NOTE — ED Triage Notes (Addendum)
Pt c/o lower back pain that radiates down his right leg. States it started about 3 days ago but has been getting worse. He has not had any recent injuries or strenuous activity. He has chronic back pain and has taken pain meds on file with no relief.  ?

## 2022-02-27 NOTE — ED Provider Notes (Signed)
?[facility] ? ? ? ?CSN: [id number] ?Arrival date & time: 02/27/22  1149 ? ? ?  ? ?History   ?Chief Complaint ?Chief Complaint  ?Patient presents with  ? Back Pain  ? ? ?HPI ?Trevor Newman is a 66 y.o. male.  ? ?HPI ? ? ?Patient is here for low back pain.  He has chronic low back pain.  He is under the care of the pain specialist at [facility].  He takes Dilaudid 5 times a day.  This is for his routine pain.  He also has cyclobenzaprine to take 3 times a day and states he has been taking these.  He takes alprazolam twice daily as needed as well.  In spite of this he has "severe" low back pain on the left side.  He has pain down the right leg.  He states he also has pain in his right ribs.  This has been worsening for the last few days.  No history of kidney stones or kidney infection.  No fever or chills.  No nausea or vomiting.  No urinary symptoms frequency or hematuria.  He had no accident or injury.  He has no weakness in his leg. ?He has a history of vascular disease including cerebrovascular disease and coronary artery disease.  He has COPD obstructive sleep apnea.  Prior history of tobacco abuse. ?Past Medical History:  ?Diagnosis Date  ? Anxiety   ? Arthritis   ? Asthma   ? Carotid artery stenosis   ? Chest pain   ? Chronic back pain   ? on Dilaudid  ? COPD (chronic obstructive pulmonary disease) ([place])   ? COVID-19 2021  ? GERD (gastroesophageal reflux disease)   ? Hyperglycemia   ? Hyperlipidemia   ? Migraine   ? Seizure ([place])   ? Sleep apnea   ? no CPAP  ? Syncope   ? Tick bite of abdominal wall   ? Tobacco abuse   ? Vitamin D deficiency   ? ? ?Patient Active Problem List  ? Diagnosis Date Noted  ? Cervical spondylosis with radiculopathy 01/06/2020  ? OSA (obstructive sleep apnea) 07/04/2019  ? 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 Seizure ([place])   ? Chest pain   ? Carotid artery stenosis   ? Migraine   ? Arthritis   ? GERD (gastroesophageal reflux disease)   ? Hyperlipidemia   ?  Tobacco abuse   ? Vitamin D deficiency   ? Hyperglycemia   ? Syncope   ? BPH (benign prostatic hyperplasia) 05/07/2013  ? Anxiety 11/18/2012  ? CAD (coronary artery disease) 02/01/2012  ? History of gastroesophageal reflux (GERD) 10/18/2011  ? CKD (chronic kidney disease) stage 3, GFR 30-59 ml/min (HCC) 08/29/2011  ? ? ?Past Surgical History:  ?Procedure Laterality Date  ? COLON SURGERY    ? DRUG INDUCED ENDOSCOPY N/A 07/04/2019  ? Procedure: DRUG INDUCED SLEEP ENDOSCOPY;  Surgeon: Jerrell Belfast, MD;  Location: [place];  Service: ENT;  Laterality: N/A;  ? GALLBLADDER SURGERY    ? ROTATOR CUFF REPAIR Right   ? ? ? ? ? ?Home Medications   ? ?Prior to Admission medications   ?Medication Sig Start Date End Date Taking? Authorizing Provider  ?methylPREDNISolone (MEDROL DOSEPAK) 4 MG TBPK tablet tad 02/27/22  Yes Raylene Everts, MD  ?terbinafine (LAMISIL) 250 MG tablet Take by mouth. 01/14/21  Yes [provider]  ?tiZANidine (ZANAFLEX) 4 MG tablet Take 1-2 tablets (4-8 mg  total) by mouth every 6 (six) hours as needed for muscle spasms. 02/27/22  Yes Eustace Moore, MD  ?ALPRAZolam Prudy Feeler) 1 MG tablet Take 0.5 mg by mouth 2 (two) times daily as needed.  08/04/13   [provider]  ?aspirin 325 MG tablet Take 325 mg by mouth daily.    [provider]  ?atorvastatin (LIPITOR) 20 MG tablet Take 20 mg by mouth daily. 08/04/13   [provider]  ?budesonide-formoterol (SYMBICORT) 160-4.5 MCG/ACT inhaler Inhale 2 puffs into the lungs 2 (two) times daily.    [provider]  ?Cetirizine HCl 10 MG CAPS Take by mouth daily.    [provider]  ?doxycycline (VIBRAMYCIN) 100 MG capsule Take 1 capsule (100 mg total) by mouth 2 (two) times daily. 02/24/21   Moshe Cipro, NP  ?DULoxetine (CYMBALTA) 60 MG capsule Take 60 mg by mouth daily.    [provider]  ?fenofibrate (TRICOR) 48 MG tablet Take 48 mg by mouth daily. 08/05/13   [provider]  ?HYDROmorphone (DILAUDID) 4 MG tablet Take by mouth every 4 (four) hours as needed for severe pain.    [provider]  ?LIDODERM 5 %  05/10/13   [provider]  ?NEXIUM 40 MG capsule Take 40 mg by mouth daily. 08/04/13   [provider]  ?polyethylene glycol powder (GLYCOLAX/MIRALAX) powder  06/30/13   [provider]  ?PROAIR HFA 108 (90 BASE) MCG/ACT inhaler  06/30/13   [provider]  ?sucralfate (CARAFATE) 1 g tablet Take 1 g by mouth 4 (four) times daily. 02/24/22   [provider]  ?SUMAtriptan (IMITREX) 100 MG tablet Take 100 mg by mouth daily. 04/30/13   [provider]  ?tamsulosin (FLOMAX) 0.4 MG CAPS capsule Take 0.4 mg by mouth.    [provider]  ?triamcinolone (KENALOG) 0.1 % Apply 1 application topically 2 (two) times daily. 02/24/21   Moshe Cipro, NP  ?VOLTAREN 1 % GEL  07/09/13   [provider]  ? ? ?Family History ?Family History  ?Problem Relation Age of Onset  ? Diabetes Mother   ? Kidney disease Mother   ? Skin cancer Father   ? Lung cancer Father   ? ? ?Social History ?Social History  ? ?Tobacco Use  ? Smoking status: Former  ?  Types: Cigarettes  ? Smokeless tobacco: Never  ? Tobacco comments:  ?  Quit 4 years ago  ?Substance Use Topics  ? Alcohol use: Not Currently  ?  Alcohol/week: 2.0 standard drinks  ?  Types: 2 Cans of beer per week  ?  Comment: two beers once a month  ? Drug use: No  ?  Types: Cocaine  ?  Comment: Quit 10 years ago.  ? ? ? ?Allergies   ?Botox [botulinum toxin type a], Codeine, Hydrocodone, and Tramadol ? ? ?Review of Systems ?Review of Systems ?See HPI ? ?Physical Exam ?Triage Vital Signs ?ED Triage Vitals  ?Enc Vitals Group  ?   BP 02/27/22 1214 112/75  ?   Pulse Rate 02/27/22 1214 82  ?   Resp 02/27/22 1214 18  ?   Temp 02/27/22 1214 98.2 ?F (36.8 ?C)  ?   Temp Source 02/27/22 1214 Oral  ?   SpO2 02/27/22 1214 98 %  ?   Weight --   ?   Height --   ?   Head Circumference  --   ?   Peak Flow --   ?  Pain Score 02/27/22 1215 9  ?   Pain Loc --   ?   Pain Edu? --   ?   Excl. in [place]? --   ? ?No data found. ? ?Updated Vital Signs ?BP 112/75 (BP Location: Right Arm)   Pulse 82   Temp 98.2 ?F (36.8 ?C) (Oral)   Resp 18   SpO2 98%  ?   ? ?Physical Exam ?Constitutional:   ?   General: He is in acute distress.  ?   Appearance: He is well-developed and normal weight.  ?   Comments: Patient is acutely uncomfortable.  Guarded movements.  Flexed posture.  ?HENT:  ?   Head: Normocephalic and atraumatic.  ?   Nose:  ?   Comments: Mask is in place ?Eyes:  ?   Conjunctiva/sclera: Conjunctivae normal.  ?   Pupils: Pupils are equal, round, and reactive to light.  ?Cardiovascular:  ?   Rate and Rhythm: Normal rate.  ?Pulmonary:  ?   Effort: Pulmonary effort is normal. No respiratory distress.  ?   Breath sounds: Normal breath sounds.  ?Abdominal:  ?   General: There is no distension.  ?   Palpations: Abdomen is soft.  ?Musculoskeletal:     ?   General: No swelling. Normal range of motion.  ?   Cervical back: Normal range of motion.  ?   Comments: Patient appears uncomfortable.  He has tenderness on the left low back at the L5-S1 junction and left paraspinous muscles.  No palpable muscle spasm.  No tenderness over the spinous processes, sacrum, or SI regions.  Patient also has pain with pressure on the right rib cage.  Lungs are clear.  Heart is regular.  Reflexes are 1+ and equal at the knee and ankle.  Straight leg raise is negative bilaterally.  Full right leg extension he complains of slight increased back pain.  ?Skin: ?   General: Skin is warm and dry.  ?Neurological:  ?   General: No focal deficit present.  ?   Mental Status: He is alert.  ?   Sensory: No sensory deficit.  ?   Motor: No weakness.  ?   Gait: Gait abnormal.  ?   Deep Tendon Reflexes: Reflexes normal.  ?Psychiatric:     ?   Mood and Affect: Mood normal.     ?   Behavior: Behavior normal.  ? ? ? ?UC Treatments / Results   ?Labs ?(all labs ordered are listed, but only abnormal results are displayed) ?Labs Reviewed - No data to display ? ?EKG ? ? ?Radiology ?DG Chest 2 View ? ?Result Date: 02/27/2022 ?CLINICAL DATA:  Right lateral lower rib pain

## 2022-04-19 ENCOUNTER — Emergency Department (INDEPENDENT_AMBULATORY_CARE_PROVIDER_SITE_OTHER)
Admission: EM | Admit: 2022-04-19 | Discharge: 2022-04-19 | Disposition: A | Discharge status: Home / Self Care | Payer: Medicare Other | Source: Home / Self Care | Attending: Family Medicine | Admitting: Family Medicine

## 2022-04-19 DIAGNOSIS — J22 Unspecified acute lower respiratory infection: Secondary | ICD-10-CM | POA: Diagnosis not present

## 2022-04-19 DIAGNOSIS — Z87891 Personal history of nicotine dependence: Secondary | ICD-10-CM

## 2022-04-19 DIAGNOSIS — J441 Chronic obstructive pulmonary disease with (acute) exacerbation: Secondary | ICD-10-CM

## 2022-04-19 MED ORDER — DOXYCYCLINE HYCLATE 100 MG PO CAPS: 100.0000 mg | ORAL_CAPSULE | Freq: Two times a day (BID) | ORAL | 0 refills | Status: AC

## 2022-04-19 MED ORDER — PREDNISONE 20 MG PO TABS: 40.0000 mg | ORAL_TABLET | Freq: Every day | ORAL | 0 refills | Status: AC

## 2022-04-19 NOTE — ED Provider Notes (Signed)
?[facility] ? ? ? ?CSN: [id number] ?Arrival date & time: 04/19/22  1018 ? ? ?  ? ?History   ?Chief Complaint ?Chief Complaint  ?Patient presents with  ? Cough  ? ? ?HPI ?Trevor Newman is a 66 y.o. male.  ? ?HPI ? ?Patient is cigarette smoker with COPD.  He currently has an upper respiratory infection.  He has cough and chest congestion, green sputum, body aches and fatigue.  This has been going on for a week.  Not responding to his usual inhalers.  No shortness of breath or wheezing ? ?Past Medical History:  ?Diagnosis Date  ? Anxiety   ? Arthritis   ? Asthma   ? Carotid artery stenosis   ? Chest pain   ? Chronic back pain   ? on Dilaudid  ? COPD (chronic obstructive pulmonary disease) (HCC)   ? COVID-19 2021  ? GERD (gastroesophageal reflux disease)   ? Hyperglycemia   ? Hyperlipidemia   ? Migraine   ? Seizure (HCC)   ? Sleep apnea   ? no CPAP  ? Syncope   ? Tick bite of abdominal wall   ? Tobacco abuse   ? Vitamin D deficiency   ? ? ?Patient Active Problem List  ? Diagnosis Date Noted  ? Cervical spondylosis with radiculopathy 01/06/2020  ? OSA (obstructive sleep apnea) 07/04/2019  ? 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 Seizure (HCC)   ? Chest pain   ? Carotid artery stenosis   ? Migraine   ? Arthritis   ? GERD (gastroesophageal reflux disease)   ? Hyperlipidemia   ? Tobacco abuse   ? Vitamin D deficiency   ? Hyperglycemia   ? Syncope   ? BPH (benign prostatic hyperplasia) 05/07/2013  ? Anxiety 11/18/2012  ? CAD (coronary artery disease) 02/01/2012  ? History of gastroesophageal reflux (GERD) 10/18/2011  ? CKD (chronic kidney disease) stage 3, GFR 30-59 ml/min (HCC) 08/29/2011  ? ? ?Past Surgical History:  ?Procedure Laterality Date  ? COLON SURGERY    ? DRUG INDUCED ENDOSCOPY N/A 07/04/2019  ? Procedure: DRUG INDUCED SLEEP ENDOSCOPY;  Surgeon: Osborn Coho, MD;  Location: [facility];  Service: ENT;  Laterality: N/A;  ? GALLBLADDER SURGERY    ? ROTATOR CUFF REPAIR  Right   ? ? ? ? ? ?Home Medications   ? ?Prior to Admission medications   ?Medication Sig Start Date End Date Taking? Authorizing Provider  ?doxycycline (VIBRAMYCIN) 100 MG capsule Take 1 capsule (100 mg total) by mouth 2 (two) times daily. 04/19/22  Yes Eustace Moore, MD  ?predniSONE (DELTASONE) 20 MG tablet Take 2 tablets (40 mg total) by mouth daily with breakfast. 04/19/22  Yes Eustace Moore, MD  ?ALPRAZolam Prudy Feeler) 1 MG tablet Take 0.5 mg by mouth 2 (two) times daily as needed.  08/04/13   [provider]  ?aspirin 325 MG tablet Take 325 mg by mouth daily.    [provider]  ?atorvastatin (LIPITOR) 20 MG tablet Take 20 mg by mouth daily. 08/04/13   [provider]  ?budesonide-formoterol (SYMBICORT) 160-4.5 MCG/ACT inhaler Inhale 2 puffs into the lungs 2 (two) times daily.    [provider]  ?Cetirizine HCl 10 MG CAPS Take by mouth daily.    [provider]  ?DULoxetine (CYMBALTA) 60 MG capsule Take 60 mg by mouth daily.    [provider]  ?fenofibrate (TRICOR) 48 MG tablet Take 48 mg by  mouth daily. 08/05/13   [provider]  ?HYDROmorphone (DILAUDID) 4 MG tablet Take by mouth every 4 (four) hours as needed for severe pain.    [provider]  ?LIDODERM 5 %  05/10/13   [provider]  ?NEXIUM 40 MG capsule Take 40 mg by mouth daily. 08/04/13   [provider]  ?polyethylene glycol powder (GLYCOLAX/MIRALAX) powder  06/30/13   [provider]  ?PROAIR HFA 108 (90 BASE) MCG/ACT inhaler  06/30/13   [provider]  ?sucralfate (CARAFATE) 1 g tablet Take 1 g by mouth 4 (four) times daily. 02/24/22   [provider]  ?SUMAtriptan (IMITREX) 100 MG tablet Take 100 mg by mouth daily. 04/30/13   [provider]  ?tamsulosin (FLOMAX) 0.4 MG CAPS capsule Take 0.4 mg by mouth.    [provider]  ?terbinafine (LAMISIL) 250 MG tablet Take by mouth. 01/14/21   [provider]   ? ? ?Family History ?Family History  ?Problem Relation Age of Onset  ? Diabetes Mother   ? Kidney disease Mother   ? Skin cancer Father   ? Lung cancer Father   ? ? ?Social History ?Social History  ? ?Tobacco Use  ? Smoking status: Former  ?  Types: Cigarettes  ? Smokeless tobacco: Never  ? Tobacco comments:  ?  Quit 4 years ago  ?Substance Use Topics  ? Alcohol use: Not Currently  ?  Alcohol/week: 2.0 standard drinks  ?  Types: 2 Cans of beer per week  ?  Comment: two beers once a month  ? Drug use: No  ?  Types: Cocaine  ?  Comment: Quit 10 years ago.  ? ? ? ?Allergies   ?Botox [botulinum toxin type a], Codeine, Hydrocodone, and Tramadol ? ? ?Review of Systems ?Review of Systems ?See HPI ? ?Physical Exam ?Triage Vital Signs ?ED Triage Vitals  ?Enc Vitals Group  ?   BP 04/19/22 1036 113/71  ?   Pulse Rate 04/19/22 1036 89  ?   Resp 04/19/22 1036 18  ?   Temp 04/19/22 1036 98.3 ?F (36.8 ?C)  ?   Temp Source 04/19/22 1036 Oral  ?   SpO2 04/19/22 1036 98 %  ?   Weight --   ?   Height --   ?   Head Circumference --   ?   Peak Flow --   ?   Pain Score 04/19/22 1037 0  ?   Pain Loc --   ?   Pain Edu? --   ?   Excl. in [place]? --   ? ?No data found. ? ?Updated Vital Signs ?BP 113/71 (BP Location: Right Arm)   Pulse 89   Temp 98.3 ?F (36.8 ?C) (Oral)   Resp 18   SpO2 98%  ?   ? ?Physical Exam ?Constitutional:   ?   General: He is not in acute distress. ?   Appearance: He is well-developed. He is ill-appearing.  ?HENT:  ?   Head: Normocephalic and atraumatic.  ?   Right Ear: Tympanic membrane and ear canal normal.  ?   Left Ear: Tympanic membrane and ear canal normal.  ?   Nose: Congestion and rhinorrhea present.  ?   Mouth/Throat:  ?   Pharynx: Posterior oropharyngeal erythema present.  ?Eyes:  ?   Conjunctiva/sclera: Conjunctivae normal.  ?   Pupils: Pupils are equal, round, and reactive to light.  ?Cardiovascular:  ?   Rate and Rhythm: Normal rate  and regular rhythm.  ?   Heart sounds: Normal heart sounds.  ?Pulmonary:   ?   Effort: Pulmonary effort is normal. No respiratory distress.  ?   Breath sounds: Rhonchi present. No wheezing or rales.  ?Abdominal:  ?   General: There is no distension.  ?   Palpations: Abdomen is soft.  ?Musculoskeletal:     ?   General: Normal range of motion.  ?   Cervical back: Normal range of motion.  ?Skin: ?   General: Skin is warm and dry.  ?Neurological:  ?   Mental Status: He is alert.  ?Psychiatric:     ?   Mood and Affect: Mood normal.     ?   Behavior: Behavior normal.  ? ? ? ?UC Treatments / Results  ?Labs ?(all labs ordered are listed, but only abnormal results are displayed) ?Labs Reviewed - No data to display ? ?EKG ? ? ?Radiology ?No results found. ? ?Procedures ?Procedures (including critical care time) ? ?Medications Ordered in UC ?Medications - No data to display ? ?Initial Impression / Assessment and Plan / UC Course  ?I have reviewed the triage vital signs and the nursing notes. ? ?Pertinent labs & imaging results that were available during my care of the patient were reviewed by me and considered in my medical decision making (see chart for details). ? ?  ? ?Final Clinical Impressions(s) / UC Diagnoses  ? ?Final diagnoses:  ?LRTI (lower respiratory tract infection)  ?History of tobacco use  ?COPD exacerbation (HCC)  ? ? ? ?Discharge Instructions   ? ?  ?Take prednisone 1 a day for 5 days ?This will help with the congestion in your head and the coughing ?Take the doxycycline antibiotic 2 times a day for 7 days ?It is important to take this antibiotic with food ?May use over-the-counter cough medicine ?See your doctor if not improving by next week ? ? ?ED Prescriptions   ? ? Medication Sig Dispense Auth. Provider  ? predniSONE (DELTASONE) 20 MG tablet Take 2 tablets (40 mg total) by mouth daily with breakfast. 10 tablet Eustace MooreNelson, Venida Tsukamoto Sue, MD  ? doxycycline (VIBRAMYCIN) 100 MG capsule Take 1 capsule (100 mg total) by mouth 2 (two) times daily. 14 capsule Eustace MooreNelson, Ovie Eastep Sue, MD  ? ?   ? ?PDMP not reviewed this encounter. ?  ?Eustace MooreNelson, Gilman Olazabal Sue, MD ?04/19/22 1202 ? ?

## 2022-04-19 NOTE — ED Triage Notes (Signed)
Pt c/o cough since Friday. Worsening since yesterday. Denies fever. Hx seasonal allergies. Also c/o HA and ear pain/pressure. No OTC meds tried.  ?

## 2022-04-19 NOTE — Discharge Instructions (Signed)
Take prednisone 1 a day for 5 days ?This will help with the congestion in your head and the coughing ?Take the doxycycline antibiotic 2 times a day for 7 days ?It is important to take this antibiotic with food ?May use over-the-counter cough medicine ?See your doctor if not improving by next week ?
